# Patient Record
Sex: Female | Born: 1955 | Race: White | Hispanic: No | Smoking: Never smoker
Health system: Southern US, Community
[De-identification: ages and names within clinical notes are randomized; demographics above are authoritative.]

## PROBLEM LIST (undated history)

## (undated) DIAGNOSIS — F32A Depression, unspecified: Secondary | ICD-10-CM

## (undated) DIAGNOSIS — Z87442 Personal history of urinary calculi: Secondary | ICD-10-CM

## (undated) DIAGNOSIS — F419 Anxiety disorder, unspecified: Secondary | ICD-10-CM

## (undated) DIAGNOSIS — K219 Gastro-esophageal reflux disease without esophagitis: Secondary | ICD-10-CM

## (undated) DIAGNOSIS — F329 Major depressive disorder, single episode, unspecified: Secondary | ICD-10-CM

## (undated) HISTORY — PX: HAND SURGERY: SHX662

## (undated) HISTORY — PX: REDUCTION MAMMAPLASTY: SUR839

## (undated) HISTORY — PX: ABDOMINAL HYSTERECTOMY: SHX81

---

## 1998-08-30 ENCOUNTER — Encounter: Payer: Self-pay | Admitting: *Deleted

## 1998-08-30 ENCOUNTER — Ambulatory Visit (HOSPITAL_COMMUNITY): Admission: RE | Admit: 1998-08-30 | Discharge: 1998-08-30 | Payer: Self-pay | Admitting: *Deleted

## 1999-02-14 ENCOUNTER — Other Ambulatory Visit: Admission: RE | Admit: 1999-02-14 | Discharge: 1999-03-12 | Payer: Self-pay | Admitting: *Deleted

## 1999-12-27 ENCOUNTER — Encounter: Payer: Self-pay | Admitting: *Deleted

## 1999-12-27 ENCOUNTER — Ambulatory Visit (HOSPITAL_COMMUNITY): Admission: RE | Admit: 1999-12-27 | Discharge: 1999-12-27 | Payer: Self-pay | Admitting: *Deleted

## 2000-03-02 ENCOUNTER — Other Ambulatory Visit: Admission: RE | Admit: 2000-03-02 | Discharge: 2000-03-02 | Payer: Self-pay | Admitting: *Deleted

## 2001-01-07 ENCOUNTER — Encounter: Payer: Self-pay | Admitting: *Deleted

## 2001-01-07 ENCOUNTER — Ambulatory Visit (HOSPITAL_COMMUNITY): Admission: RE | Admit: 2001-01-07 | Discharge: 2001-01-07 | Payer: Self-pay | Admitting: *Deleted

## 2001-03-30 ENCOUNTER — Other Ambulatory Visit: Admission: RE | Admit: 2001-03-30 | Discharge: 2001-03-30 | Payer: Self-pay | Admitting: *Deleted

## 2002-04-11 ENCOUNTER — Other Ambulatory Visit: Admission: RE | Admit: 2002-04-11 | Discharge: 2002-04-11 | Payer: Self-pay | Admitting: *Deleted

## 2002-07-06 ENCOUNTER — Ambulatory Visit (HOSPITAL_COMMUNITY): Admission: RE | Admit: 2002-07-06 | Discharge: 2002-07-06 | Payer: Self-pay | Admitting: *Deleted

## 2002-07-06 ENCOUNTER — Encounter: Payer: Self-pay | Admitting: *Deleted

## 2003-04-11 ENCOUNTER — Other Ambulatory Visit: Admission: RE | Admit: 2003-04-11 | Discharge: 2003-04-11 | Payer: Self-pay | Admitting: *Deleted

## 2004-01-02 ENCOUNTER — Ambulatory Visit (HOSPITAL_COMMUNITY): Admission: RE | Admit: 2004-01-02 | Discharge: 2004-01-02 | Payer: Self-pay | Admitting: *Deleted

## 2004-03-25 ENCOUNTER — Other Ambulatory Visit: Admission: RE | Admit: 2004-03-25 | Discharge: 2004-03-25 | Payer: Self-pay | Admitting: *Deleted

## 2004-04-10 ENCOUNTER — Ambulatory Visit (HOSPITAL_BASED_OUTPATIENT_CLINIC_OR_DEPARTMENT_OTHER): Admission: RE | Admit: 2004-04-10 | Discharge: 2004-04-10 | Payer: Self-pay | Admitting: Orthopedic Surgery

## 2005-01-15 ENCOUNTER — Other Ambulatory Visit: Admission: RE | Admit: 2005-01-15 | Discharge: 2005-01-15 | Payer: Self-pay | Admitting: *Deleted

## 2005-04-14 HISTORY — PX: LITHOTRIPSY: SUR834

## 2005-08-14 ENCOUNTER — Ambulatory Visit (HOSPITAL_COMMUNITY): Admission: RE | Admit: 2005-08-14 | Discharge: 2005-08-14 | Payer: Self-pay | Admitting: *Deleted

## 2006-02-02 ENCOUNTER — Emergency Department (HOSPITAL_COMMUNITY): Admission: EM | Admit: 2006-02-02 | Discharge: 2006-02-02 | Payer: Self-pay | Admitting: Emergency Medicine

## 2006-02-05 ENCOUNTER — Ambulatory Visit (HOSPITAL_COMMUNITY): Admission: RE | Admit: 2006-02-05 | Discharge: 2006-02-05 | Payer: Self-pay | Admitting: Urology

## 2006-02-16 ENCOUNTER — Other Ambulatory Visit: Admission: RE | Admit: 2006-02-16 | Discharge: 2006-02-16 | Payer: Self-pay | Admitting: *Deleted

## 2007-01-21 ENCOUNTER — Ambulatory Visit (HOSPITAL_COMMUNITY): Admission: RE | Admit: 2007-01-21 | Discharge: 2007-01-21 | Payer: Self-pay | Admitting: *Deleted

## 2007-09-13 ENCOUNTER — Other Ambulatory Visit: Admission: RE | Admit: 2007-09-13 | Discharge: 2007-09-13 | Payer: Self-pay | Admitting: Gynecology

## 2007-09-21 ENCOUNTER — Ambulatory Visit (HOSPITAL_COMMUNITY): Admission: RE | Admit: 2007-09-21 | Discharge: 2007-09-21 | Payer: Self-pay | Admitting: Family Medicine

## 2008-03-20 ENCOUNTER — Ambulatory Visit (HOSPITAL_COMMUNITY): Admission: RE | Admit: 2008-03-20 | Discharge: 2008-03-20 | Payer: Self-pay | Admitting: Gynecology

## 2009-03-22 ENCOUNTER — Ambulatory Visit (HOSPITAL_COMMUNITY): Admission: RE | Admit: 2009-03-22 | Discharge: 2009-03-22 | Payer: Self-pay | Admitting: Gynecology

## 2010-03-25 ENCOUNTER — Ambulatory Visit (HOSPITAL_COMMUNITY)
Admission: RE | Admit: 2010-03-25 | Discharge: 2010-03-25 | Payer: Self-pay | Source: Home / Self Care | Attending: Obstetrics and Gynecology | Admitting: Obstetrics and Gynecology

## 2010-08-30 NOTE — Op Note (Signed)
NAMEANTHA, Lauren Peters                   ACCOUNT NO.:  [id number]   MEDICAL RECORD NO.:  [id number]          PATIENT TYPE:  AMB   LOCATION:  DSC                          FACILITY:  [facility]   PHYSICIAN:  Cindee Salt, M.D.       DATE OF BIRTH:  1955-09-06   DATE OF PROCEDURE:  04/10/2004  DATE OF DISCHARGE:                                 OPERATIVE REPORT   PREOPERATIVE DIAGNOSIS:  Carpal tunnel syndrome, right hand.   POSTOPERATIVE DIAGNOSIS:  Carpal tunnel syndrome, right hand.   OPERATION:  Decompression of the right median nerve.   SURGEON:  Cindee Salt, M.D.   ASSISTANTCarolyne Fiscal.   ANESTHESIA:  Forearm-based IV regional.   HISTORY:  The patient is a 55 year old female with a history of carpal  tunnel syndrome, EMG nerve conduction positive on the right hand.  She is  brought for surgical decompression.  This has not been responsive to  conservative treatment.   PROCEDURE:  The patient was brought to the operating room, where a forearm-  based IV regional anesthetic was carried out without difficulty.  She was  prepped using DuraPrep in supine position, the right arm free.  A  longitudinal incision was made in the palm and carried down through  subcutaneous tissue.  Bleeders were electrocauterized.  The palmar fascia  was split, the superficial palmar arch identified, the flexor tendon to the  ring and little finger identified to the ulnar side of the median nerve.  The carpal retinaculum was incised with sharp dissection and a right angle  and Sewell retractor were placed between skin and forearm fascia.  The  fascia was released for approximately 1.5 cm proximal to the wrist crease  under direct vision.  The canal was explored.  Tenosynovial tissue was  moderately thickened, an area of compression to the nerve apparent.  The  wound was irrigated.  The skin was closed with interrupted 5-0 nylon suture.  A sterile compressive dressing and splint were applied.  The patient  tolerated  the procedure well and was taken to the recovery room for  observation in satisfactory condition.  She is discharged home to return to  the [facility] in one week on Vicodin.       GK/MEDQ  D:  04/10/2004  T:  04/10/2004  Job:  [id number]

## 2011-02-10 ENCOUNTER — Encounter (HOSPITAL_BASED_OUTPATIENT_CLINIC_OR_DEPARTMENT_OTHER)
Admission: RE | Admit: 2011-02-10 | Discharge: 2011-02-10 | Disposition: A | Discharge status: Home / Self Care | Payer: BC Managed Care – PPO | Source: Ambulatory Visit | Attending: Orthopedic Surgery | Admitting: Orthopedic Surgery

## 2011-02-11 ENCOUNTER — Other Ambulatory Visit: Payer: Self-pay | Admitting: Orthopedic Surgery

## 2011-02-11 ENCOUNTER — Ambulatory Visit (HOSPITAL_BASED_OUTPATIENT_CLINIC_OR_DEPARTMENT_OTHER)
Admission: RE | Admit: 2011-02-11 | Discharge: 2011-02-11 | Disposition: A | Discharge status: Home / Self Care | Payer: BC Managed Care – PPO | Source: Ambulatory Visit | Attending: Orthopedic Surgery | Admitting: Orthopedic Surgery

## 2011-02-11 DIAGNOSIS — Z01810 Encounter for preprocedural cardiovascular examination: Secondary | ICD-10-CM | POA: Insufficient documentation

## 2011-02-11 DIAGNOSIS — M9380 Other specified osteochondropathies of unspecified site: Secondary | ICD-10-CM | POA: Insufficient documentation

## 2011-02-14 ENCOUNTER — Encounter: Payer: Self-pay | Admitting: Internal Medicine

## 2011-02-14 NOTE — Op Note (Signed)
Lauren Peters, Lauren Peters NO.:  [id number]  MEDICAL RECORD NO.:  [id number]  LOCATION:                                 FACILITY:  PHYSICIAN:  Betha Loa, MD             DATE OF BIRTH:  DATE OF PROCEDURE:  02/11/2011 DATE OF DISCHARGE:                              OPERATIVE REPORT   PREOPERATIVE DIAGNOSIS:  Right wrist Kienbock disease.  POSTOPERATIVE DIAGNOSIS:  Right wrist Kienbock disease.  PROCEDURE:  Right proximal row carpectomy.  SURGEON:  Betha Loa, MD  ASSISTANT:  Cindee Salt, MD  ANESTHESIA:  General with regional.  IV FLUIDS:  Per anesthesia flow sheet.  ESTIMATED BLOOD LOSS:  Minimal.  COMPLICATIONS:  None.  FINDINGS:  Collapsed lunate.  SPECIMENS:  Lunate to pathology.  TOURNIQUET TIME:  57 minutes.  DISPOSITION:  Stable to PACU.  INDICATIONS:  Lauren Peters is a 55 year old female who approximately 6 months ago began to have pain in the right wrist.  She followed up with me in the office.  She had undergone physical therapy without relief of her pain.  Radiographs followed by CT and MRI revealed a right wrist Kienbock disease.  This was stage IIIB with collapse of the lunate.  I discussed with Lauren Peters the nature of the condition.  We discussed various treatment options and settled on proximal row carpectomy as a method of treatment for her.  Risks, benefits and alternatives of surgery were discussed including the risk of blood loss, infection, damage to nerves, vessels, tendons, ligaments, bone, failure to surgery, need for additional surgery, complications with wound healing, continued pain, and stiffness.  She voiced understanding of these risks and elected to proceed.  OPERATIVE COURSE:  After being identified preoperatively by myself, the patient and I agreed upon procedure and site of procedure.  The surgical site was marked.  The risks, benefits, and alternatives of surgery were reviewed and she wished to proceed.  Surgical  consent had been signed. She was given 1 g of IV Ancef as preoperative antibiotic prophylaxis. She was transported to the operating room and placed on the operating table in supine position with the right upper extremity on an armboard. General anesthesia was induced by the anesthesiologist.  Regional block had been performed by Anesthesia in preoperative holding.  The right upper extremity was prepped and draped in normal sterile orthopedic fashion.  Surgical pause was performed between surgeons, anesthesia, and operating staff and all were in agreement as to the patient, procedure, and site of procedure.  Tourniquet at the proximal aspect of the extremities inflated to 250 mmHg after exsanguination of the limb with an Esmarch bandage.  A longitudinal incision was made, centered over the lunate fossa.  This carried into subcutaneous tissues.  Bipolar electrocautery was used for hemostasis throughout the case.  Care was taken to protect all neurovascular structures.  The third dorsal compartment was released and the EPL taken radially.  The fourth dorsal compartment was opened.  The interval between the third and fourth dorsal compartment was used.  The capsule was incised longitudinally. The synovium was very thickened.  This was sharply removed.  The lunate was visualized and had collapsed.  The proximal pole of the capitate was identified.  There was small amount of chondral damage at the radial side.  This would be nonarticular.  It was approximately 1 x 3 mm in length.  The scaphoid was then removed in a piecemeal fashion.  Care was taken to protect all volar capsule and ligaments.  Attention was then turned to the triquetrum.  It was felt that a capsular interposition was not needed.  The capsule was teed over at the proximal aspect.  The cup was left to repair afterward.  The triquetrum was then removed in its entirety.  The lunate was then removed again by sharply releasing  all attachments.  Care was taken throughout the case to protect all volar ligaments and the capsule.  The lunate was sent to pathology for examination.  The capitate dropped into the lunate fossa nicely.  The C- arm was used in AP and lateral projections to ensure appropriate position, which was the case.  The wound was then copiously irrigated with sterile saline.  It was felt that there was no impingement of the radial styloid.  The capsule was repaired using 4-0 Vicryl suture in a figure-of-eight fashion.  The fourth dorsal compartment was repaired again using 4-0 Vicryl suture.  A posterior interosseous nerve neurectomy had been performed.  Two inverted interrupted Vicryl sutures were placed in the skin and the skin was then closed with 4-0 nylon in a horizontal mattress fashion.  The wound was then dressed with sterile Xeroform, 4x4s, and wrapped with Kerlix.  A volar and dorsal slab splint was placed and wrapped with Kerlix and Ace bandage.  The tourniquet was deflated at 57 minutes.  The fingertips were pink with brisk capillary refill after deflation of the tourniquet.  A C-arm was used again in AP and lateral projections after placement of the splint to ensure appropriate position of the carpus, which was the case.  The patient was awoken from anesthesia safely.  The operative drapes had been broken down.  She was transferred back to stretcher and taken to PACU in stable condition.  I will give her Percocet 5/325 1-2 p.o. q.6 hours p.r.n. pain, dispensed #50.  We will see her back in the office in 1 week for postoperative followup.     Betha Loa, MD     KK/MEDQ  D:  02/11/2011  T:  02/11/2011  Job:  [id number]  Electronically Signed by Betha Loa  on 02/14/2011 09:10:50 AM

## 2011-03-28 ENCOUNTER — Other Ambulatory Visit: Payer: BC Managed Care – PPO | Admitting: Internal Medicine

## 2013-11-16 ENCOUNTER — Other Ambulatory Visit: Payer: Self-pay | Admitting: Sports Medicine

## 2013-11-16 DIAGNOSIS — M5442 Lumbago with sciatica, left side: Secondary | ICD-10-CM

## 2013-11-23 ENCOUNTER — Other Ambulatory Visit: Payer: BC Managed Care – PPO

## 2013-11-25 ENCOUNTER — Other Ambulatory Visit: Payer: BC Managed Care – PPO

## 2013-12-09 ENCOUNTER — Other Ambulatory Visit: Payer: BC Managed Care – PPO

## 2014-10-03 ENCOUNTER — Other Ambulatory Visit: Payer: Self-pay | Admitting: Orthopedic Surgery

## 2014-10-03 DIAGNOSIS — S46001A Unspecified injury of muscle(s) and tendon(s) of the rotator cuff of right shoulder, initial encounter: Secondary | ICD-10-CM

## 2014-10-19 ENCOUNTER — Other Ambulatory Visit: Payer: Self-pay

## 2014-10-23 ENCOUNTER — Ambulatory Visit
Admission: RE | Admit: 2014-10-23 | Discharge: 2014-10-23 | Disposition: A | Discharge status: Home / Self Care | Payer: BLUE CROSS/BLUE SHIELD | Source: Ambulatory Visit | Attending: Orthopedic Surgery | Admitting: Orthopedic Surgery

## 2014-10-23 DIAGNOSIS — S46001A Unspecified injury of muscle(s) and tendon(s) of the rotator cuff of right shoulder, initial encounter: Secondary | ICD-10-CM

## 2014-11-20 ENCOUNTER — Other Ambulatory Visit: Payer: Self-pay | Admitting: Obstetrics and Gynecology

## 2014-11-23 LAB — CYTOLOGY - PAP

## 2016-07-16 ENCOUNTER — Encounter (HOSPITAL_COMMUNITY): Payer: Self-pay

## 2016-07-16 ENCOUNTER — Emergency Department (HOSPITAL_COMMUNITY)
Admission: EM | Admit: 2016-07-16 | Discharge: 2016-07-17 | Disposition: A | Discharge status: Home / Self Care | Payer: BLUE CROSS/BLUE SHIELD | Attending: Emergency Medicine | Admitting: Emergency Medicine

## 2016-07-16 DIAGNOSIS — M7989 Other specified soft tissue disorders: Secondary | ICD-10-CM | POA: Diagnosis not present

## 2016-07-16 NOTE — ED Triage Notes (Signed)
Pt says that she has been sitting at a desk for a year which is very different than what she was doing last year

## 2016-07-16 NOTE — ED Triage Notes (Signed)
Pt complains of right leg swelling since Monday and today the right foot is aldo swollen Pt also states that it's tight and sore

## 2016-07-17 ENCOUNTER — Ambulatory Visit (HOSPITAL_BASED_OUTPATIENT_CLINIC_OR_DEPARTMENT_OTHER)
Admission: RE | Admit: 2016-07-17 | Discharge: 2016-07-17 | Disposition: A | Discharge status: Home / Self Care | Payer: BLUE CROSS/BLUE SHIELD | Source: Ambulatory Visit | Attending: Emergency Medicine | Admitting: Emergency Medicine

## 2016-07-17 DIAGNOSIS — M79661 Pain in right lower leg: Secondary | ICD-10-CM | POA: Insufficient documentation

## 2016-07-17 DIAGNOSIS — M7989 Other specified soft tissue disorders: Secondary | ICD-10-CM | POA: Diagnosis not present

## 2016-07-17 LAB — BASIC METABOLIC PANEL
Anion gap: 10 (ref 5–15)
BUN: 15 mg/dL (ref 6–20)
CALCIUM: 9.7 mg/dL (ref 8.9–10.3)
CO2: 25 mmol/L (ref 22–32)
CREATININE: 0.67 mg/dL (ref 0.44–1.00)
Chloride: 105 mmol/L (ref 101–111)
Glucose, Bld: 116 mg/dL — ABNORMAL HIGH (ref 65–99)
Potassium: 3.8 mmol/L (ref 3.5–5.1)
SODIUM: 140 mmol/L (ref 135–145)

## 2016-07-17 LAB — CBC
HCT: 40.8 % (ref 36.0–46.0)
Hemoglobin: 14.1 g/dL (ref 12.0–15.0)
MCH: 29.1 pg (ref 26.0–34.0)
MCHC: 34.6 g/dL (ref 30.0–36.0)
MCV: 84.3 fL (ref 78.0–100.0)
PLATELETS: 254 10*3/uL (ref 150–400)
RBC: 4.84 MIL/uL (ref 3.87–5.11)
RDW: 13.2 % (ref 11.5–15.5)
WBC: 7.7 10*3/uL (ref 4.0–10.5)

## 2016-07-17 LAB — D-DIMER, QUANTITATIVE (NOT AT ARMC): D DIMER QUANT: 0.34 ug{FEU}/mL (ref 0.00–0.50)

## 2016-07-17 MED ORDER — RIVAROXABAN 15 MG PO TABS
15.0000 mg | ORAL_TABLET | Freq: Once | ORAL | Status: AC
  Administered 2016-07-17: 15 mg via ORAL
  Filled 2016-07-17: qty 1

## 2016-07-17 NOTE — Progress Notes (Signed)
*  PRELIMINARY RESULTS* Vascular Ultrasound Right lower extremity venous duplex has been completed.  Preliminary findings: No evidence of deep vein thrombosis in the right lower extremity.  No evidence of Baker's cyst in the popliteal fossa although there does appear to be a small pocket of fluid near the gastronemius muscle in the proximal calf, possible ruptured cyst, 4.4 x 3.9cm?   Everrett Coombe 07/17/2016, 10:32 AM

## 2016-07-17 NOTE — ED Provider Notes (Signed)
[facility] DEPT Provider Note: Georgena Spurling, MD, FACEP  CSN: [id number] MRN: [id number] ARRIVAL: 07/16/16 at 2138 ROOM: WA21/WA21 By signing my name below, I, Dyke Brackett, attest that this documentation has been prepared under the direction and in the presence of Shanon Rosser, MD . Electronically Signed: Dyke Brackett, Scribe. 07/17/2016. 12:54 AM.   CHIEF COMPLAINT  Leg Swelling  HISTORY OF PRESENT ILLNESS  Lauren Peters is a 61 y.o. female who presents to the Emergency Department complaining of persistent, gradually worsening right leg swelling onset three days ago. She noticed her right ankle was swollen this morning. She reports associated moderate pain to her right calf, worse with ambulation or palpation, and paresthesias of her right lateral thigh. Pt states her right calf feels "tight". Pt reports that she has been sitting a lot at work lately but denies any recent injury. Pt denies chest pain or SOB.  History reviewed. No pertinent past medical history.  History reviewed. No pertinent surgical history.  History reviewed. No pertinent family history.  Social History  Substance Use Topics  . Smoking status: Never Smoker  . Smokeless tobacco: Never Used  . Alcohol use Yes    Prior to Admission medications   Medication Sig Start Date End Date Taking? Authorizing Provider  buPROPion (WELLBUTRIN) 75 MG tablet Take 75 mg by mouth daily.   Yes Historical Provider, MD  naproxen sodium (ANAPROX) 220 MG tablet Take 220 mg by mouth daily as needed (pain).   Yes Historical Provider, MD  omeprazole (PRILOSEC) 40 MG capsule Take 1 capsule by mouth daily. 06/24/16  Yes Historical Provider, MD    Allergies Albuterol and Sulfamethoxazole   REVIEW OF SYSTEMS  Negative except as noted here or in the History of Present Illness.  PHYSICAL EXAMINATION  Initial Vital Signs Blood pressure (!) 148/89, pulse 98, temperature 98.1 F (36.7 C), temperature source Oral, resp. rate 18,  height 5' (1.524 m), weight 156 lb (70.8 kg), SpO2 96 %.  Examination General: Well-developed, well-nourished female in no acute distress; appearance consistent with age of record HENT: normocephalic; atraumatic Eyes: pupils equal, round and reactive to light; extraocular muscles intact Neck: supple Heart: regular rate and rhythm Lungs: clear to auscultation bilaterally Abdomen: soft; nondistended; nontender; bowel sounds present Extremities: Full range of motion; pulses normal; tenderness and +1 edema of right calf and ankle.  Neurologic: Awake, alert and oriented; motor function intact in all extremities and symmetric; no facial droop Skin: Warm and dry Psychiatric: Normal mood and affect  RESULTS  Summary of this visit's results, reviewed by myself:   EKG Interpretation  Date/Time:    Ventricular Rate:    PR Interval:    QRS Duration:   QT Interval:    QTC Calculation:   R Axis:     Text Interpretation:        Laboratory Studies: Results for orders placed or performed during the hospital encounter of 07/16/16 (from the past 24 hour(s))  Basic metabolic panel     Status: Abnormal   Collection Time: 07/16/16 11:45 PM  Result Value Ref Range   Sodium 140 135 - 145 mmol/L   Potassium 3.8 3.5 - 5.1 mmol/L   Chloride 105 101 - 111 mmol/L   CO2 25 22 - 32 mmol/L   Glucose, Bld 116 (H) 65 - 99 mg/dL   BUN 15 6 - 20 mg/dL   Creatinine, Ser 0.67 0.44 - 1.00 mg/dL   Calcium 9.7 8.9 - 10.3 mg/dL   GFR calc  non Af Amer >60 >60 mL/min   GFR calc Af Amer >60 >60 mL/min   Anion gap 10 5 - 15  CBC     Status: None   Collection Time: 07/16/16 11:45 PM  Result Value Ref Range   WBC 7.7 4.0 - 10.5 K/uL   RBC 4.84 3.87 - 5.11 MIL/uL   Hemoglobin 14.1 12.0 - 15.0 g/dL   HCT 40.8 36.0 - 46.0 %   MCV 84.3 78.0 - 100.0 fL   MCH 29.1 26.0 - 34.0 pg   MCHC 34.6 30.0 - 36.0 g/dL   RDW 13.2 11.5 - 15.5 %   Platelets 254 150 - 400 K/uL  D-dimer, quantitative (not at [facility])     Status:  None   Collection Time: 07/16/16 11:45 PM  Result Value Ref Range   D-Dimer, Quant 0.34 0.00 - 0.50 ug/mL-FEU   Imaging Studies: No results found.  ED COURSE  Nursing notes and initial vitals signs, including pulse oximetry, reviewed.  Vitals:   07/16/16 2143 07/16/16 2351  BP: (!) 168/102 (!) 148/89  Pulse: (!) 116 98  Resp: 16 18  Temp: 98.3 F (36.8 C) 98.1 F (36.7 C)  TempSrc: Oral Oral  SpO2: 96% 96%  Weight: 174 lb (78.9 kg) 156 lb (70.8 kg)  Height:  5' (1.524 m)   1:03 AM We'll treat with Xarelto (patient refuses Lovenox injection) and have her follow-up with the vascular lab at [facility] later this morning for Doppler ultrasound.  PROCEDURES    ED DIAGNOSES     ICD-9-CM ICD-10-CM   1. Right leg swelling 729.81 M79.89      I personally performed the services described in this documentation, which was scribed in my presence. The recorded information has been reviewed and is accurate.    Shanon Rosser, MD 07/17/16 [phone]

## 2017-05-18 ENCOUNTER — Other Ambulatory Visit: Payer: Self-pay | Admitting: Obstetrics and Gynecology

## 2017-05-18 DIAGNOSIS — R928 Other abnormal and inconclusive findings on diagnostic imaging of breast: Secondary | ICD-10-CM

## 2017-05-20 ENCOUNTER — Ambulatory Visit
Admission: RE | Admit: 2017-05-20 | Discharge: 2017-05-20 | Disposition: A | Discharge status: Home / Self Care | Payer: BLUE CROSS/BLUE SHIELD | Source: Ambulatory Visit | Attending: Obstetrics and Gynecology | Admitting: Obstetrics and Gynecology

## 2017-05-20 DIAGNOSIS — R928 Other abnormal and inconclusive findings on diagnostic imaging of breast: Secondary | ICD-10-CM

## 2017-10-21 DIAGNOSIS — R0781 Pleurodynia: Secondary | ICD-10-CM | POA: Diagnosis not present

## 2017-10-21 DIAGNOSIS — J069 Acute upper respiratory infection, unspecified: Secondary | ICD-10-CM | POA: Diagnosis not present

## 2017-10-21 DIAGNOSIS — N390 Urinary tract infection, site not specified: Secondary | ICD-10-CM | POA: Diagnosis not present

## 2017-12-01 DIAGNOSIS — R635 Abnormal weight gain: Secondary | ICD-10-CM | POA: Diagnosis not present

## 2017-12-30 DIAGNOSIS — L292 Pruritus vulvae: Secondary | ICD-10-CM | POA: Diagnosis not present

## 2017-12-30 DIAGNOSIS — R300 Dysuria: Secondary | ICD-10-CM | POA: Diagnosis not present

## 2017-12-30 DIAGNOSIS — N393 Stress incontinence (female) (male): Secondary | ICD-10-CM | POA: Diagnosis not present

## 2018-01-19 DIAGNOSIS — R635 Abnormal weight gain: Secondary | ICD-10-CM | POA: Diagnosis not present

## 2018-01-19 DIAGNOSIS — E559 Vitamin D deficiency, unspecified: Secondary | ICD-10-CM | POA: Diagnosis not present

## 2018-01-19 DIAGNOSIS — Z6831 Body mass index (BMI) 31.0-31.9, adult: Secondary | ICD-10-CM | POA: Diagnosis not present

## 2018-02-08 DIAGNOSIS — N9089 Other specified noninflammatory disorders of vulva and perineum: Secondary | ICD-10-CM | POA: Diagnosis not present

## 2018-03-02 DIAGNOSIS — R635 Abnormal weight gain: Secondary | ICD-10-CM | POA: Diagnosis not present

## 2018-03-02 DIAGNOSIS — E559 Vitamin D deficiency, unspecified: Secondary | ICD-10-CM | POA: Diagnosis not present

## 2018-03-05 DIAGNOSIS — J0100 Acute maxillary sinusitis, unspecified: Secondary | ICD-10-CM | POA: Diagnosis not present

## 2018-04-13 DIAGNOSIS — R635 Abnormal weight gain: Secondary | ICD-10-CM | POA: Diagnosis not present

## 2018-04-13 DIAGNOSIS — R0602 Shortness of breath: Secondary | ICD-10-CM | POA: Diagnosis not present

## 2018-06-08 DIAGNOSIS — Z6831 Body mass index (BMI) 31.0-31.9, adult: Secondary | ICD-10-CM | POA: Diagnosis not present

## 2018-06-08 DIAGNOSIS — Z01419 Encounter for gynecological examination (general) (routine) without abnormal findings: Secondary | ICD-10-CM | POA: Diagnosis not present

## 2018-06-08 DIAGNOSIS — Z1231 Encounter for screening mammogram for malignant neoplasm of breast: Secondary | ICD-10-CM | POA: Diagnosis not present

## 2018-06-25 DIAGNOSIS — R635 Abnormal weight gain: Secondary | ICD-10-CM | POA: Diagnosis not present

## 2018-08-03 DIAGNOSIS — R635 Abnormal weight gain: Secondary | ICD-10-CM | POA: Diagnosis not present

## 2018-08-16 DIAGNOSIS — F325 Major depressive disorder, single episode, in full remission: Secondary | ICD-10-CM | POA: Diagnosis not present

## 2018-08-16 DIAGNOSIS — K219 Gastro-esophageal reflux disease without esophagitis: Secondary | ICD-10-CM | POA: Diagnosis not present

## 2018-08-17 DIAGNOSIS — N23 Unspecified renal colic: Secondary | ICD-10-CM | POA: Diagnosis not present

## 2018-08-17 DIAGNOSIS — R109 Unspecified abdominal pain: Secondary | ICD-10-CM | POA: Diagnosis not present

## 2018-08-17 DIAGNOSIS — R319 Hematuria, unspecified: Secondary | ICD-10-CM | POA: Diagnosis not present

## 2018-08-19 ENCOUNTER — Emergency Department (HOSPITAL_COMMUNITY)
Admission: EM | Admit: 2018-08-19 | Discharge: 2018-08-19 | Disposition: A | Discharge status: Home / Self Care | Payer: BLUE CROSS/BLUE SHIELD | Attending: Emergency Medicine | Admitting: Emergency Medicine

## 2018-08-19 ENCOUNTER — Emergency Department (HOSPITAL_COMMUNITY): Payer: BLUE CROSS/BLUE SHIELD

## 2018-08-19 ENCOUNTER — Other Ambulatory Visit: Payer: Self-pay

## 2018-08-19 DIAGNOSIS — Z87442 Personal history of urinary calculi: Secondary | ICD-10-CM | POA: Insufficient documentation

## 2018-08-19 DIAGNOSIS — N201 Calculus of ureter: Secondary | ICD-10-CM | POA: Insufficient documentation

## 2018-08-19 DIAGNOSIS — Z79899 Other long term (current) drug therapy: Secondary | ICD-10-CM | POA: Insufficient documentation

## 2018-08-19 DIAGNOSIS — R110 Nausea: Secondary | ICD-10-CM | POA: Insufficient documentation

## 2018-08-19 DIAGNOSIS — R1032 Left lower quadrant pain: Secondary | ICD-10-CM | POA: Diagnosis present

## 2018-08-19 DIAGNOSIS — N130 Hydronephrosis with ureteropelvic junction obstruction: Secondary | ICD-10-CM | POA: Insufficient documentation

## 2018-08-19 DIAGNOSIS — N200 Calculus of kidney: Secondary | ICD-10-CM | POA: Diagnosis not present

## 2018-08-19 DIAGNOSIS — N132 Hydronephrosis with renal and ureteral calculous obstruction: Secondary | ICD-10-CM | POA: Diagnosis not present

## 2018-08-19 LAB — COMPREHENSIVE METABOLIC PANEL
ALT: 19 U/L (ref 0–44)
AST: 16 U/L (ref 15–41)
Albumin: 3.9 g/dL (ref 3.5–5.0)
Alkaline Phosphatase: 77 U/L (ref 38–126)
Anion gap: 9 (ref 5–15)
BUN: 9 mg/dL (ref 8–23)
CO2: 24 mmol/L (ref 22–32)
Calcium: 9 mg/dL (ref 8.9–10.3)
Chloride: 108 mmol/L (ref 98–111)
Creatinine, Ser: 0.93 mg/dL (ref 0.44–1.00)
GFR calc Af Amer: >60 mL/min (ref >60)
GFR calc non Af Amer: >60 mL/min (ref >60)
Glucose, Bld: 154 mg/dL — ABNORMAL HIGH (ref 70–99)
Potassium: 3.7 mmol/L (ref 3.5–5.1)
Sodium: 141 mmol/L (ref 135–145)
Total Bilirubin: 0.8 mg/dL (ref 0.3–1.2)
Total Protein: 6.6 g/dL (ref 6.5–8.1)

## 2018-08-19 LAB — CBC WITH DIFFERENTIAL/PLATELET
Abs Immature Granulocytes: 0.04 10*3/uL (ref 0.00–0.07)
Basophils Absolute: 0 10*3/uL (ref 0.0–0.1)
Basophils Relative: 1 %
Eosinophils Absolute: 0.2 10*3/uL (ref 0.0–0.5)
Eosinophils Relative: 4 %
HCT: 43.8 % (ref 36.0–46.0)
Hemoglobin: 14.8 g/dL (ref 12.0–15.0)
Immature Granulocytes: 1 %
Lymphocytes Relative: 26 %
Lymphs Abs: 1.5 10*3/uL (ref 0.7–4.0)
MCH: 29.6 pg (ref 26.0–34.0)
MCHC: 33.8 g/dL (ref 30.0–36.0)
MCV: 87.6 fL (ref 80.0–100.0)
Monocytes Absolute: 0.4 10*3/uL (ref 0.1–1.0)
Monocytes Relative: 7 %
Neutro Abs: 3.8 10*3/uL (ref 1.7–7.7)
Neutrophils Relative %: 61 %
Platelets: 250 10*3/uL (ref 150–400)
RBC: 5 MIL/uL (ref 3.87–5.11)
RDW: 13 % (ref 11.5–15.5)
WBC: 6 10*3/uL (ref 4.0–10.5)
nRBC: 0 % (ref 0.0–0.2)

## 2018-08-19 LAB — URINALYSIS, ROUTINE W REFLEX MICROSCOPIC
Bilirubin Urine: NEGATIVE
Glucose, UA: NEGATIVE mg/dL
Ketones, ur: NEGATIVE mg/dL
Nitrite: NEGATIVE
Protein, ur: NEGATIVE mg/dL
RBC / HPF: >50 RBC/hpf — ABNORMAL HIGH (ref 0–5)
Specific Gravity, Urine: 1.018 (ref 1.005–1.030)
pH: 5 (ref 5.0–8.0)

## 2018-08-19 MED ORDER — KETOROLAC TROMETHAMINE 30 MG/ML IJ SOLN
30.0000 mg | Freq: Once | INTRAMUSCULAR | Status: AC
  Administered 2018-08-19: 30 mg via INTRAVENOUS
  Filled 2018-08-19: qty 1

## 2018-08-19 MED ORDER — HYDROMORPHONE HCL 1 MG/ML IJ SOLN
1.0000 mg | Freq: Once | INTRAMUSCULAR | Status: AC
  Administered 2018-08-19: 1 mg via INTRAVENOUS
  Filled 2018-08-19: qty 1

## 2018-08-19 NOTE — ED Notes (Signed)
Got patient into a gown patient is resting with call bell in reach 

## 2018-08-19 NOTE — Discharge Instructions (Addendum)
Please follow-up at the urology office today for further management of your kidney stone. Take the pain medication that was prescribed to you. Return to the ED if you start to develop a fever, worsening pain or vomiting, chest pain or loss of consciousness.

## 2018-08-19 NOTE — ED Triage Notes (Signed)
Pt here for L flank pain x 2 days, worsening this morning. Hx kidney stones and sts it feels the same. Seen by PCP Tuesday and was given Flomax.

## 2018-08-19 NOTE — ED Notes (Signed)
Patient verbalizes understanding of discharge instructions. Opportunity for questioning and answers were provided. Armband removed by staff, pt discharged from ED via wc with family

## 2018-08-19 NOTE — ED Provider Notes (Signed)
[facility] EMERGENCY DEPARTMENT Provider Note   CSN: [id number] Arrival date & time: 08/19/18  5956    History   Chief Complaint Chief Complaint  Patient presents with   Flank Pain    HPI Lauren Peters is a 63 y.o. female who presents to ED for evaluation of 2-day history of left-sided flank pain.  Describes the pain as sharp, gradually worsening since this morning.  Pain has been intermittent.  She was seen and evaluated by PCP 2 days ago, urinalysis was done which showed hematuria and was thought to be consistent with h/o kidney stone and discharged home with Flomax and hydrocodone.  She has been taking the Flomax but has not been taking her hydrocodone because "I do not like taking pain medicine." She did not take pain medicine this morning when her pain got worse because "I figured I was going to come to the emergency room so I didn't want to take anything." She reports some nausea 2/2 pain but denies any changes to bowel movements, fever, vomiting, dysuria, injuries or falls, shortness of breath, leg pain.  States that she has had to undergo lithotripsy x1 but her other kidney stones have passed without difficulty.     HPI  No past medical history on file.  There are no active problems to display for this patient.   Past Surgical History:  Procedure Laterality Date   REDUCTION MAMMAPLASTY       OB History   No obstetric history on file.      Home Medications    Prior to Admission medications   Medication Sig Start Date End Date Taking? Authorizing Provider  buPROPion (WELLBUTRIN) 75 MG tablet Take 75 mg by mouth daily.    [provider]  naproxen sodium (ANAPROX) 220 MG tablet Take 220 mg by mouth daily as needed (pain).    [provider]  omeprazole (PRILOSEC) 40 MG capsule Take 1 capsule by mouth daily. 06/24/16   [provider]    Family History Family History  Problem Relation Age of Onset   Breast cancer Other      Social History Social History   Tobacco Use   Smoking status: Never Smoker   Smokeless tobacco: Never Used  Substance Use Topics   Alcohol use: Yes   Drug use: Not on file     Allergies   Albuterol and Sulfamethoxazole   Review of Systems Review of Systems  Constitutional: Negative for appetite change, chills and fever.  HENT: Negative for ear pain, rhinorrhea, sneezing and sore throat.   Eyes: Negative for photophobia and visual disturbance.  Respiratory: Negative for cough, chest tightness, shortness of breath and wheezing.   Cardiovascular: Negative for chest pain and palpitations.  Gastrointestinal: Positive for nausea. Negative for abdominal pain, blood in stool, constipation, diarrhea and vomiting.  Genitourinary: Positive for flank pain. Negative for dysuria, hematuria and urgency.  Musculoskeletal: Negative for myalgias.  Skin: Negative for rash.  Neurological: Negative for dizziness, weakness and light-headedness.     Physical Exam Updated Vital Signs BP 134/73    Pulse 99    Temp 98.2 F (36.8 C) (Oral)    Resp 18    Ht 5' (1.524 m)    Wt 72.6 kg    SpO2 95%    BMI 31.25 kg/m   Physical Exam Vitals signs and nursing note reviewed.  Constitutional:      General: She is not in acute distress.    Appearance: She is  well-developed.     Comments: Appears uncomfortable.  HENT:     Head: Normocephalic and atraumatic.     Nose: Nose normal.  Eyes:     General: No scleral icterus.       Right eye: No discharge.        Left eye: No discharge.     Conjunctiva/sclera: Conjunctivae normal.  Neck:     Musculoskeletal: Normal range of motion and neck supple.  Cardiovascular:     Rate and Rhythm: Normal rate and regular rhythm.     Heart sounds: Normal heart sounds. No murmur. No friction rub. No gallop.   Pulmonary:     Effort: Pulmonary effort is normal. No respiratory distress.     Breath sounds: Normal breath sounds.  Abdominal:     General: Bowel  sounds are normal. There is no distension.     Palpations: Abdomen is soft.     Tenderness: There is abdominal tenderness. There is no guarding.     Comments: L flank.  Musculoskeletal: Normal range of motion.  Skin:    General: Skin is warm and dry.     Findings: No rash.  Neurological:     Mental Status: She is alert.     Motor: No abnormal muscle tone.     Coordination: Coordination normal.      ED Treatments / Results  Labs (all labs ordered are listed, but only abnormal results are displayed) Labs Reviewed  COMPREHENSIVE METABOLIC PANEL - Abnormal; Notable for the following components:      Result Value   Glucose, Bld 154 (*)    All other components within normal limits  URINALYSIS, ROUTINE W REFLEX MICROSCOPIC - Abnormal; Notable for the following components:   APPearance HAZY (*)    Hgb urine dipstick LARGE (*)    Leukocytes,Ua MODERATE (*)    RBC / HPF >50 (*)    Bacteria, UA RARE (*)    All other components within normal limits  URINE CULTURE  CBC WITH DIFFERENTIAL/PLATELET    EKG None  Radiology Ct Renal Stone Study  Result Date: 08/19/2018 CLINICAL DATA:  Left flank pain EXAM: CT ABDOMEN AND PELVIS WITHOUT CONTRAST TECHNIQUE: Multidetector CT imaging of the abdomen and pelvis was performed following the standard protocol without IV contrast. COMPARISON:  None. FINDINGS: Lower chest: No acute abnormality. Hepatobiliary: No focal liver abnormality is seen. No gallstones, gallbladder wall thickening, or biliary dilatation. Pancreas: Unremarkable. No pancreatic ductal dilatation or surrounding inflammatory changes. Spleen: Normal in size without focal abnormality. Adrenals/Urinary Tract: Adrenal glands are unremarkable. There is an 8 mm obstructive calculus at the left ureteropelvic junction with mild left hydronephrosis. There are no additional renal or ureteral calculi. Bladder is unremarkable. Stomach/Bowel: Stomach is within normal limits. Appendix appears normal.  No evidence of bowel wall thickening, distention, or inflammatory changes. Vascular/Lymphatic: Scattered calcific atherosclerosis. No enlarged abdominal or pelvic lymph nodes. Reproductive: Status post hysterectomy. Other: No abdominal wall hernia or abnormality. No abdominopelvic ascites. Musculoskeletal: No acute or significant osseous findings. IMPRESSION: There is an 8 mm obstructive calculus at the left ureteropelvic junction with mild left hydronephrosis. There are no additional renal or ureteral calculi. Electronically Signed   By: Eddie Candle M.D.   On: 08/19/2018 09:58    Procedures Procedures (including critical care time)  Medications Ordered in ED Medications  HYDROmorphone (DILAUDID) injection 1 mg (1 mg Intravenous Given 08/19/18 0924)  ketorolac (TORADOL) 30 MG/ML injection 30 mg (30 mg Intravenous Given 08/19/18 1055)  Initial Impression / Assessment and Plan / ED Course  I have reviewed the triage vital signs and the nursing notes.  Pertinent labs & imaging results that were available during my care of the patient were reviewed by me and considered in my medical decision making (see chart for details).        63 year old female presents to ED for 2-day history of left-sided flank pain.  She reports associated nausea secondary to the pain.  She was given Flomax and hydrocodone by her PCP.  She has been taking the Flomax but does not enjoy taking pain medication so she has not taken the hydrocodone.  She states that this feels similar to her prior kidney stones which have required lithotripsy once but have otherwise passed.  She denies any fevers, dysuria or gross hematuria.  PCP diagnosed probable kidney stones based on hematuria on urinalysis 2 days ago.  On my exam patient does appear uncomfortable.  She complains of left-sided pain.  Abdomen is soft, nontender nondistended.  Vital signs are within normal limits, she denies fever any recent use of antipyretics.  Urinalysis  shows hematuria, moderate leukocytes, rare bacteria and some pyuria.  CBC, CMP unremarkable.  CT renal stone study shows 8 mm stone at the left UPJ.  Patient given Dilaudid with some improvement in her symptoms.  I will consult urology for further recommendations. Her primary urologist is Dr. Risa Grill at [facility].  10:41 AM Spoke to Dr. Junious Silk, urologist.  He recommends giving patient Toradol and following up in the office for possible shockwave lithotripsy, as long as patient appears stable, not febrile or septic.  11:26 AM Patient given Toradol with significant improvement in her symptoms.  Her daughter has called the urology office and is scheduled for an appointment at 2:30 PM today.  She was encouraged to take her home pain medication as needed and return for any worsening symptoms.  Patient is agreeable to the plan.  Patient is hemodynamically stable, in NAD, and able to ambulate in the ED. Evaluation does not show pathology that would require ongoing emergent intervention or inpatient treatment. I explained the diagnosis to the patient. Pain has been managed and has no complaints prior to discharge. Patient is comfortable with above plan and is stable for discharge at this time. All questions were answered prior to disposition. Strict return precautions for returning to the ED were discussed. Encouraged follow up with PCP.   An After Visit Summary was printed and given to the patient.   Portions of this note were generated with [employer]. Dictation errors may occur despite best attempts at proofreading.  Final Clinical Impressions(s) / ED Diagnoses   Final diagnoses:  Ureterolithiasis    ED Discharge Orders    None       Delia Heady, PA-C 08/19/18 1243    Lennice Sites, DO 08/19/18 1626

## 2018-08-19 NOTE — ED Notes (Signed)
Patient transported to CT 

## 2018-08-20 ENCOUNTER — Encounter (HOSPITAL_COMMUNITY): Payer: Self-pay | Admitting: General Practice

## 2018-08-20 ENCOUNTER — Other Ambulatory Visit (HOSPITAL_COMMUNITY)
Admission: RE | Admit: 2018-08-20 | Discharge: 2018-08-20 | Disposition: A | Discharge status: Home / Self Care | Payer: BLUE CROSS/BLUE SHIELD | Source: Ambulatory Visit | Attending: Urology | Admitting: Urology

## 2018-08-20 ENCOUNTER — Other Ambulatory Visit: Payer: Self-pay | Admitting: Urology

## 2018-08-20 DIAGNOSIS — Z1159 Encounter for screening for other viral diseases: Secondary | ICD-10-CM | POA: Insufficient documentation

## 2018-08-20 LAB — URINE CULTURE

## 2018-08-22 LAB — NOVEL CORONAVIRUS, NAA (HOSP ORDER, SEND-OUT TO REF LAB; TAT 18-24 HRS): SARS-CoV-2, NAA: NOT DETECTED

## 2018-08-22 NOTE — H&P (Signed)
H&P  Chief Complaint: Kidney stone  History of Present Illness: Lauren Peters is a 63 y.o. year old female presenting for ESL for mgmt of urolithiasis.  She underwent CT scan of the abdomen and pelvis on 08/19/2018 which revealed an 8 mm left UPJ stone (visible, 10 cm ssd, HU 1600).    Past Medical History:  Diagnosis Date  . Acid reflux   . Anxiety   . Depression   . History of kidney stones     Past Surgical History:  Procedure Laterality Date  . ABDOMINAL HYSTERECTOMY     over 30 years ago  . HAND SURGERY Right    x2 on right hand, carpal tunnel, bone removed  . LITHOTRIPSY  2007  . REDUCTION MAMMAPLASTY     20 years ago    Home Medications:  No medications prior to admission.    Allergies:  Allergies  Allergen Reactions  . Albuterol Other (See Comments)    Hot flashes/ flush/   . Sulfamethoxazole Rash    Family History  Problem Relation Age of Onset  . Breast cancer Other     Social History:  reports that she has never smoked. She has never used smokeless tobacco. She reports current alcohol use. No history on file for drug.  ROS: A complete review of systems was performed.  All systems are negative except for pertinent findings as noted.  Physical Exam:  Vital signs in last 24 hours:   General:  Alert and oriented, No acute distress HEENT: Normocephalic, atraumatic Neck: No JVD or lymphadenopathy Cardiovascular: Regular rate and rhythm Lungs: Clear bilaterally Abdomen: Soft, nontender, nondistended, no abdominal masses Back: No CVA tenderness Extremities: No edema Neurologic: Grossly intact  Laboratory Data:  No results found for this or any previous visit (from the past 24 hour(s)). Recent Results (from the past 240 hour(s))  Urine culture     Status: Abnormal   Collection Time: 08/19/18  9:17 AM  Result Value Ref Range Status   Specimen Description URINE, RANDOM  Final   Special Requests   Final    NONE Performed at [facility],  [address], [place], [place] [address]    Culture MULTIPLE SPECIES PRESENT, SUGGEST RECOLLECTION (A)  Final   Report Status 08/20/2018 FINAL  Final   Creatinine: Recent Labs    08/19/18 0917  CREATININE 0.93    Radiologic Imaging: No results found.  Impression/Assessment:  8 mm left UPJ stone  Plan:  ESL. The pt is aware that this may be part of a staged process.  Lillette Boxer Nyia Tsao 08/22/2018, 9:00 PM  Green Marquis Diles MD

## 2018-08-23 ENCOUNTER — Ambulatory Visit (HOSPITAL_COMMUNITY): Payer: BLUE CROSS/BLUE SHIELD

## 2018-08-23 ENCOUNTER — Encounter (HOSPITAL_COMMUNITY)
Admission: RE | Disposition: A | Discharge status: Home / Self Care | Payer: Self-pay | Source: Home / Self Care | Attending: Urology

## 2018-08-23 ENCOUNTER — Ambulatory Visit (HOSPITAL_COMMUNITY)
Admission: RE | Admit: 2018-08-23 | Discharge: 2018-08-23 | Disposition: A | Discharge status: Home / Self Care | Payer: BLUE CROSS/BLUE SHIELD | Attending: Urology | Admitting: Urology

## 2018-08-23 ENCOUNTER — Encounter (HOSPITAL_COMMUNITY): Payer: Self-pay | Admitting: General Practice

## 2018-08-23 DIAGNOSIS — F329 Major depressive disorder, single episode, unspecified: Secondary | ICD-10-CM | POA: Insufficient documentation

## 2018-08-23 DIAGNOSIS — N135 Crossing vessel and stricture of ureter without hydronephrosis: Secondary | ICD-10-CM

## 2018-08-23 DIAGNOSIS — F419 Anxiety disorder, unspecified: Secondary | ICD-10-CM | POA: Insufficient documentation

## 2018-08-23 DIAGNOSIS — N200 Calculus of kidney: Secondary | ICD-10-CM | POA: Insufficient documentation

## 2018-08-23 DIAGNOSIS — K219 Gastro-esophageal reflux disease without esophagitis: Secondary | ICD-10-CM | POA: Diagnosis not present

## 2018-08-23 DIAGNOSIS — Z87442 Personal history of urinary calculi: Secondary | ICD-10-CM | POA: Diagnosis not present

## 2018-08-23 DIAGNOSIS — N201 Calculus of ureter: Secondary | ICD-10-CM | POA: Diagnosis not present

## 2018-08-23 HISTORY — DX: Gastro-esophageal reflux disease without esophagitis: K21.9

## 2018-08-23 HISTORY — DX: Anxiety disorder, unspecified: F41.9

## 2018-08-23 HISTORY — DX: Depression, unspecified: F32.A

## 2018-08-23 HISTORY — PX: EXTRACORPOREAL SHOCK WAVE LITHOTRIPSY: SHX1557

## 2018-08-23 HISTORY — DX: Personal history of urinary calculi: Z87.442

## 2018-08-23 HISTORY — DX: Major depressive disorder, single episode, unspecified: F32.9

## 2018-08-23 SURGERY — LITHOTRIPSY, ESWL
Anesthesia: LOCAL | Laterality: Left

## 2018-08-23 MED ORDER — SODIUM CHLORIDE 0.9 % IV SOLN
INTRAVENOUS | Status: DC
  Administered 2018-08-23: 11:00:00 via INTRAVENOUS

## 2018-08-23 MED ORDER — HYDROCODONE-ACETAMINOPHEN 5-325 MG PO TABS
1.0000 | ORAL_TABLET | Freq: Once | ORAL | Status: AC
  Administered 2018-08-23: 1 via ORAL

## 2018-08-23 MED ORDER — DIAZEPAM 5 MG PO TABS
10.0000 mg | ORAL_TABLET | ORAL | Status: AC
  Administered 2018-08-23: 10 mg via ORAL
  Filled 2018-08-23: qty 2

## 2018-08-23 MED ORDER — DIPHENHYDRAMINE HCL 25 MG PO CAPS
25.0000 mg | ORAL_CAPSULE | ORAL | Status: AC
  Administered 2018-08-23: 25 mg via ORAL
  Filled 2018-08-23: qty 1

## 2018-08-23 MED ORDER — HYDROCODONE-ACETAMINOPHEN 5-325 MG PO TABS
ORAL_TABLET | ORAL | Status: AC
  Filled 2018-08-23: qty 1

## 2018-08-23 MED ORDER — CIPROFLOXACIN HCL 500 MG PO TABS
500.0000 mg | ORAL_TABLET | ORAL | Status: AC
  Administered 2018-08-23: 500 mg via ORAL
  Filled 2018-08-23: qty 1

## 2018-08-23 SURGICAL SUPPLY — 4 items
COVER SURGICAL LIGHT HANDLE (MISCELLANEOUS) ×2 IMPLANT
COVER WAND RF STERILE (DRAPES) IMPLANT
KIT TURNOVER KIT A (KITS) IMPLANT
TOWEL OR 17X26 10 PK STRL BLUE (TOWEL DISPOSABLE) ×2 IMPLANT

## 2018-08-23 NOTE — Interval H&P Note (Signed)
History and Physical Interval Note:  08/23/2018 1:14 PM  [place]  has presented today for surgery, with the diagnosis of LEFT URETEROPELVIC JUNCTION STONE.  The various methods of treatment have been discussed with the patient and family. After consideration of risks, benefits and other options for treatment, the patient has consented to  Procedure(s): EXTRACORPOREAL SHOCK WAVE LITHOTRIPSY (ESWL) (Left) as a surgical intervention.  The patient's history has been reviewed, patient examined, no change in status, stable for surgery.  I have reviewed the patient's chart and labs.  Questions were answered to the patient's satisfaction.     Lillette Boxer Panayiota Larkin

## 2018-08-23 NOTE — Op Note (Signed)
See [facility] OP note scanned into chart. 

## 2018-08-23 NOTE — Discharge Instructions (Signed)
See [facility] discharge instructions in chart.  

## 2018-08-24 ENCOUNTER — Encounter (HOSPITAL_COMMUNITY): Payer: Self-pay | Admitting: Urology

## 2018-09-07 DIAGNOSIS — N201 Calculus of ureter: Secondary | ICD-10-CM | POA: Diagnosis not present

## 2018-11-09 DIAGNOSIS — N201 Calculus of ureter: Secondary | ICD-10-CM | POA: Diagnosis not present

## 2018-12-07 DIAGNOSIS — N201 Calculus of ureter: Secondary | ICD-10-CM | POA: Diagnosis not present

## 2019-03-29 DIAGNOSIS — M7581 Other shoulder lesions, right shoulder: Secondary | ICD-10-CM | POA: Diagnosis not present

## 2019-04-20 ENCOUNTER — Ambulatory Visit: Payer: BC Managed Care – PPO | Attending: Internal Medicine

## 2019-04-20 DIAGNOSIS — Z20822 Contact with and (suspected) exposure to covid-19: Secondary | ICD-10-CM | POA: Diagnosis not present

## 2019-04-20 DIAGNOSIS — R52 Pain, unspecified: Secondary | ICD-10-CM | POA: Diagnosis not present

## 2019-04-20 DIAGNOSIS — R0981 Nasal congestion: Secondary | ICD-10-CM | POA: Diagnosis not present

## 2019-04-20 DIAGNOSIS — G44201 Tension-type headache, unspecified, intractable: Secondary | ICD-10-CM | POA: Diagnosis not present

## 2019-04-21 LAB — NOVEL CORONAVIRUS, NAA: SARS-CoV-2, NAA: DETECTED — AB

## 2019-04-23 ENCOUNTER — Other Ambulatory Visit: Payer: Self-pay

## 2019-04-23 ENCOUNTER — Emergency Department (HOSPITAL_BASED_OUTPATIENT_CLINIC_OR_DEPARTMENT_OTHER): Payer: BC Managed Care – PPO

## 2019-04-23 ENCOUNTER — Inpatient Hospital Stay (HOSPITAL_BASED_OUTPATIENT_CLINIC_OR_DEPARTMENT_OTHER)
Admission: EM | Admit: 2019-04-23 | Discharge: 2019-05-01 | DRG: 871 | Disposition: A | Discharge status: Home / Self Care | Payer: BC Managed Care – PPO | Attending: Internal Medicine | Admitting: Internal Medicine

## 2019-04-23 ENCOUNTER — Encounter (HOSPITAL_BASED_OUTPATIENT_CLINIC_OR_DEPARTMENT_OTHER): Payer: Self-pay | Admitting: *Deleted

## 2019-04-23 DIAGNOSIS — Z79899 Other long term (current) drug therapy: Secondary | ICD-10-CM

## 2019-04-23 DIAGNOSIS — R0902 Hypoxemia: Secondary | ICD-10-CM | POA: Diagnosis present

## 2019-04-23 DIAGNOSIS — I10 Essential (primary) hypertension: Secondary | ICD-10-CM | POA: Diagnosis present

## 2019-04-23 DIAGNOSIS — Z888 Allergy status to other drugs, medicaments and biological substances status: Secondary | ICD-10-CM

## 2019-04-23 DIAGNOSIS — D696 Thrombocytopenia, unspecified: Secondary | ICD-10-CM | POA: Diagnosis not present

## 2019-04-23 DIAGNOSIS — F419 Anxiety disorder, unspecified: Secondary | ICD-10-CM | POA: Diagnosis present

## 2019-04-23 DIAGNOSIS — J1282 Pneumonia due to coronavirus disease 2019: Secondary | ICD-10-CM | POA: Diagnosis not present

## 2019-04-23 DIAGNOSIS — A4189 Other specified sepsis: Secondary | ICD-10-CM | POA: Diagnosis not present

## 2019-04-23 DIAGNOSIS — L89321 Pressure ulcer of left buttock, stage 1: Secondary | ICD-10-CM | POA: Diagnosis present

## 2019-04-23 DIAGNOSIS — Z882 Allergy status to sulfonamides status: Secondary | ICD-10-CM | POA: Diagnosis not present

## 2019-04-23 DIAGNOSIS — J9601 Acute respiratory failure with hypoxia: Secondary | ICD-10-CM | POA: Diagnosis present

## 2019-04-23 DIAGNOSIS — R279 Unspecified lack of coordination: Secondary | ICD-10-CM | POA: Diagnosis not present

## 2019-04-23 DIAGNOSIS — L89311 Pressure ulcer of right buttock, stage 1: Secondary | ICD-10-CM | POA: Diagnosis not present

## 2019-04-23 DIAGNOSIS — U071 COVID-19: Secondary | ICD-10-CM | POA: Diagnosis not present

## 2019-04-23 DIAGNOSIS — F329 Major depressive disorder, single episode, unspecified: Secondary | ICD-10-CM | POA: Diagnosis present

## 2019-04-23 DIAGNOSIS — Z743 Need for continuous supervision: Secondary | ICD-10-CM | POA: Diagnosis not present

## 2019-04-23 DIAGNOSIS — R531 Weakness: Secondary | ICD-10-CM | POA: Diagnosis not present

## 2019-04-23 DIAGNOSIS — R0602 Shortness of breath: Secondary | ICD-10-CM | POA: Diagnosis not present

## 2019-04-23 DIAGNOSIS — L8990 Pressure ulcer of unspecified site, unspecified stage: Secondary | ICD-10-CM | POA: Insufficient documentation

## 2019-04-23 DIAGNOSIS — K219 Gastro-esophageal reflux disease without esophagitis: Secondary | ICD-10-CM | POA: Diagnosis present

## 2019-04-23 DIAGNOSIS — R748 Abnormal levels of other serum enzymes: Secondary | ICD-10-CM | POA: Diagnosis not present

## 2019-04-23 DIAGNOSIS — F32A Depression, unspecified: Secondary | ICD-10-CM | POA: Diagnosis present

## 2019-04-23 DIAGNOSIS — R000 Tachycardia, unspecified: Secondary | ICD-10-CM | POA: Diagnosis not present

## 2019-04-23 LAB — COMPREHENSIVE METABOLIC PANEL
ALT: 36 U/L (ref 0–44)
AST: 38 U/L (ref 15–41)
Albumin: 3.6 g/dL (ref 3.5–5.0)
Alkaline Phosphatase: 77 U/L (ref 38–126)
Anion gap: 12 (ref 5–15)
BUN: 13 mg/dL (ref 8–23)
CO2: 24 mmol/L (ref 22–32)
Calcium: 8.1 mg/dL — ABNORMAL LOW (ref 8.9–10.3)
Chloride: 101 mmol/L (ref 98–111)
Creatinine, Ser: 0.71 mg/dL (ref 0.44–1.00)
GFR calc Af Amer: >60 mL/min (ref >60)
GFR calc non Af Amer: >60 mL/min (ref >60)
Glucose, Bld: 108 mg/dL — ABNORMAL HIGH (ref 70–99)
Potassium: 3.7 mmol/L (ref 3.5–5.1)
Sodium: 137 mmol/L (ref 135–145)
Total Bilirubin: 0.9 mg/dL (ref 0.3–1.2)
Total Protein: 6.6 g/dL (ref 6.5–8.1)

## 2019-04-23 LAB — LACTIC ACID, PLASMA: Lactic Acid, Venous: 0.9 mmol/L (ref 0.5–1.9)

## 2019-04-23 LAB — CBC WITH DIFFERENTIAL/PLATELET
Abs Immature Granulocytes: 0.02 10*3/uL (ref 0.00–0.07)
Basophils Absolute: 0 10*3/uL (ref 0.0–0.1)
Basophils Relative: 0 %
Eosinophils Absolute: 0 10*3/uL (ref 0.0–0.5)
Eosinophils Relative: 0 %
HCT: 40.7 % (ref 36.0–46.0)
Hemoglobin: 13.8 g/dL (ref 12.0–15.0)
Immature Granulocytes: 1 %
Lymphocytes Relative: 15 %
Lymphs Abs: 0.6 10*3/uL — ABNORMAL LOW (ref 0.7–4.0)
MCH: 30.3 pg (ref 26.0–34.0)
MCHC: 33.9 g/dL (ref 30.0–36.0)
MCV: 89.3 fL (ref 80.0–100.0)
Monocytes Absolute: 0.2 10*3/uL (ref 0.1–1.0)
Monocytes Relative: 5 %
Neutro Abs: 3.2 10*3/uL (ref 1.7–7.7)
Neutrophils Relative %: 79 %
Platelets: 135 10*3/uL — ABNORMAL LOW (ref 150–400)
RBC: 4.56 MIL/uL (ref 3.87–5.11)
RDW: 13.4 % (ref 11.5–15.5)
WBC: 4.1 10*3/uL (ref 4.0–10.5)
nRBC: 0 % (ref 0.0–0.2)

## 2019-04-23 LAB — D-DIMER, QUANTITATIVE: D-Dimer, Quant: 0.68 ug/mL-FEU — ABNORMAL HIGH (ref 0.00–0.50)

## 2019-04-23 NOTE — ED Provider Notes (Signed)
[facility] EMERGENCY DEPARTMENT Provider Note   CSN: [id number] Arrival date & time: 04/23/19  2139     History Chief Complaint  Patient presents with  . Shortness of Breath    Covid +    Lauren Peters is a 64 y.o. female presenting for evaluation of shortness of breath.  Patient states 1 week ago she started develop Covid-like symptoms.  She developed cough, nasal congestion, sinus pressure, and generalized body aches.  She was tested on January 6, test came back positive on the seventh.  She reports today she started to feel short of breath.  Shortness of breath is worse with exertion, talking, or any activity.  She denies known fevers, however has been taking Tylenol and ibuprofen around-the-clock for body aches and headaches.  She denies history of lung problems such as asthma or COPD.  She denies tobacco use.  She does not wear oxygen normally.  She denies chest pain, nausea vomiting, abdominal pain, urinary symptoms, abnormal bowel movements.  HPI     Past Medical History:  Diagnosis Date  . Acid reflux   . Anxiety   . Depression   . History of kidney stones     There are no problems to display for this patient.   Past Surgical History:  Procedure Laterality Date  . ABDOMINAL HYSTERECTOMY     over 30 years ago  . EXTRACORPOREAL SHOCK WAVE LITHOTRIPSY Left 08/23/2018   Procedure: EXTRACORPOREAL SHOCK WAVE LITHOTRIPSY (ESWL);  Surgeon: Franchot Gallo, MD;  Location: [facility];  Service: Urology;  Laterality: Left;  . HAND SURGERY Right    x2 on right hand, carpal tunnel, bone removed  . LITHOTRIPSY  2007  . REDUCTION MAMMAPLASTY     20 years ago     OB History   No obstetric history on file.     Family History  Problem Relation Age of Onset  . Breast cancer Other     Social History   Tobacco Use  . Smoking status: Never Smoker  . Smokeless tobacco: Never Used  Substance Use Topics  . Alcohol use: Yes    Comment: 2x week  . Drug use: Never      Home Medications Prior to Admission medications   Medication Sig Start Date End Date Taking? Authorizing Provider  buPROPion (WELLBUTRIN) 75 MG tablet Take 75 mg by mouth daily.    [provider]  cephALEXin (KEFLEX) 500 MG capsule Take 500 mg by mouth 2 (two) times daily.    [provider]  HYDROcodone-acetaminophen (NORCO/VICODIN) 5-325 MG tablet Take 1-2 tablets by mouth every 4 (four) hours as needed for moderate pain.    [provider]  naproxen sodium (ANAPROX) 220 MG tablet Take 220 mg by mouth daily as needed (pain).    [provider]  omeprazole (PRILOSEC) 40 MG capsule Take 1 capsule by mouth daily. 06/24/16   [provider]  ondansetron (ZOFRAN) 4 MG tablet Take 4 mg by mouth every 8 (eight) hours as needed for nausea or vomiting.    [provider]  tamsulosin (FLOMAX) 0.4 MG CAPS capsule Take 0.4 mg by mouth.    [provider]    Allergies    Albuterol and Sulfamethoxazole  Review of Systems   Review of Systems  HENT: Positive for congestion.   Respiratory: Positive for cough and shortness of breath.   All other systems reviewed and are negative.   Physical Exam Updated Vital Signs BP (!) 173/92 (BP Location:  Left Arm)   Pulse (!) 107   Temp 99 F (37.2 C) (Oral)   Resp (!) 28   Ht 5' (1.524 m)   Wt 70.3 kg   SpO2 100%   BMI 30.27 kg/m   Physical Exam Vitals and nursing note reviewed.  Constitutional:      General: She is not in acute distress.    Appearance: She is well-developed.     Comments: Nontoxic on my evaluation  HENT:     Head: Normocephalic and atraumatic.  Eyes:     Conjunctiva/sclera: Conjunctivae normal.     Pupils: Pupils are equal, round, and reactive to light.  Cardiovascular:     Rate and Rhythm: Normal rate and regular rhythm.     Pulses: Normal pulses.  Pulmonary:     Effort: Pulmonary effort is normal. Tachypnea present. No respiratory distress.     Breath  sounds: No wheezing.     Comments: Speaking full sentences on oxygen (patient states she was speaking in short sentences without oxygen).  Sats upper 90s on 2 L.  No signs of respiratory distress or accessory muscle use.  Tachypneic in the 20s. crackles heard in bilateral lower bases.  Abdominal:     General: There is no distension.     Palpations: Abdomen is soft. There is no mass.     Tenderness: There is no abdominal tenderness. There is no guarding or rebound.  Musculoskeletal:        General: Normal range of motion.     Cervical back: Normal range of motion and neck supple.     Right lower leg: No edema.     Left lower leg: No edema.  Skin:    General: Skin is warm and dry.     Capillary Refill: Capillary refill takes less than 2 seconds.  Neurological:     Mental Status: She is alert and oriented to person, place, and time.     ED Results / Procedures / Treatments   Labs (all labs ordered are listed, but only abnormal results are displayed) Labs Reviewed  CBC WITH DIFFERENTIAL/PLATELET - Abnormal; Notable for the following components:      Result Value   Platelets 135 (*)    Lymphs Abs 0.6 (*)    All other components within normal limits  COMPREHENSIVE METABOLIC PANEL - Abnormal; Notable for the following components:   Glucose, Bld 108 (*)    Calcium 8.1 (*)    All other components within normal limits  D-DIMER, QUANTITATIVE (NOT AT [facility]) - Abnormal; Notable for the following components:   D-Dimer, Quant 0.68 (*)    All other components within normal limits  CULTURE, BLOOD (ROUTINE X 2)  CULTURE, BLOOD (ROUTINE X 2)  LACTIC ACID, PLASMA  LACTIC ACID, PLASMA  PROCALCITONIN  LACTATE DEHYDROGENASE  FERRITIN  TRIGLYCERIDES  FIBRINOGEN  C-REACTIVE PROTEIN    EKG EKG Interpretation  Date/Time:  Saturday April 23 2019 22:29:29 EST Ventricular Rate:  100 PR Interval:    QRS Duration: 84 QT Interval:  345 QTC Calculation: 445 R Axis:   4 Text  Interpretation: Sinus tachycardia Since last tracing rate faster Confirmed by Isla Pence [phone]) on 04/23/2019 10:35:04 PM   Radiology DG Chest Port 1 View  Result Date: 04/23/2019 CLINICAL DATA:  Shortness of breath with COVID-19. EXAM: PORTABLE CHEST 1 VIEW COMPARISON:  April 08, 2016 FINDINGS: There is scattered airspace opacities bilaterally. The heart size is borderline enlarged. There is no pneumothorax. No large pleural effusion. There  is no acute osseous abnormality. IMPRESSION: Scattered bilateral airspace opacities consistent with the patient's history of viral pneumonia. Electronically Signed   By: Constance Holster M.D.   On: 04/23/2019 23:04    Procedures .Critical Care Performed by: Franchot Heidelberg, PA-C Authorized by: Franchot Heidelberg, PA-C   Critical care provider statement:    Critical care time (minutes):  35   Critical care time was exclusive of:  Separately billable procedures and treating other patients and teaching time   Critical care was necessary to treat or prevent imminent or life-threatening deterioration of the following conditions:  Respiratory failure   Critical care was time spent personally by me on the following activities:  Blood draw for specimens, development of treatment plan with patient or surrogate, evaluation of patient's response to treatment, examination of patient, obtaining history from patient or surrogate, ordering and performing treatments and interventions, ordering and review of laboratory studies, pulse oximetry, ordering and review of radiographic studies, re-evaluation of patient's condition and review of old charts   I assumed direction of critical care for this patient from another provider in my specialty: no   Comments:     Patient with tachypnea and hypoxia requiring oxygen.  She will require hospitalization for Covid.   (including critical care time)  Medications Ordered in ED Medications - No data to display  ED Course   I have reviewed the triage vital signs and the nursing notes.  Pertinent labs & imaging results that were available during my care of the patient were reviewed by me and considered in my medical decision making (see chart for details).    MDM Rules/Calculators/A&P                      Patient resenting for evaluation of shortness of breath.  Physical exam shows patient who is nontoxic on oxygen.  However, which is normal incision, patient is speaking in short sentences.  With minimal activity such as moving from wheelchair to bed or with talking, patient sats dropped to the 80s.  In the setting of a positive Covid diagnosis, concern for worsening respiratory status.  Patient without chest pain, doubt PE.  Will obtain labs, x-ray, EKG.  Patient will likely need to be admitted as she is now on oxygen. Case discussed with attending, Dr. Gilford Raid evaluated the pt.   Labs overall reassuring.  No leukocytosis.  Electrolytes stable.  X-ray viewed interpreted by me, shows bilateral opacities consistent with Covid.  Patient remains mildly tachycardic and tachypneic, but sat stable on 2 L.  Will call for admission.  Discussed with Posey Pronto from [facility] service, pt to be admitted.   Final Clinical Impression(s) / ED Diagnoses Final diagnoses:  COVID-19  Pneumonia due to COVID-19 virus  Hypoxia    Rx / DC Orders ED Discharge Orders    None       Franchot Heidelberg, PA-C 04/23/19 2346    Isla Pence, MD 04/24/19 1505

## 2019-04-23 NOTE — ED Notes (Signed)
ED Provider at bedside. 

## 2019-04-23 NOTE — ED Triage Notes (Signed)
Pt dx with covid on Jan 6th. Tonight presents with cough and SOB with exertion. O2 sats 87-88% on room air upon arrival to triage. Sats eventually increased to 93-94% at rest but drop with any activity into the 80s

## 2019-04-24 DIAGNOSIS — F32A Depression, unspecified: Secondary | ICD-10-CM | POA: Diagnosis present

## 2019-04-24 DIAGNOSIS — U071 COVID-19: Secondary | ICD-10-CM | POA: Diagnosis present

## 2019-04-24 DIAGNOSIS — F419 Anxiety disorder, unspecified: Secondary | ICD-10-CM | POA: Diagnosis present

## 2019-04-24 DIAGNOSIS — D696 Thrombocytopenia, unspecified: Secondary | ICD-10-CM | POA: Diagnosis present

## 2019-04-24 DIAGNOSIS — F329 Major depressive disorder, single episode, unspecified: Secondary | ICD-10-CM

## 2019-04-24 DIAGNOSIS — R0902 Hypoxemia: Secondary | ICD-10-CM | POA: Diagnosis present

## 2019-04-24 DIAGNOSIS — A4189 Other specified sepsis: Principal | ICD-10-CM

## 2019-04-24 DIAGNOSIS — J1282 Pneumonia due to coronavirus disease 2019: Secondary | ICD-10-CM

## 2019-04-24 LAB — CBC
HCT: 42.4 % (ref 36.0–46.0)
Hemoglobin: 14.2 g/dL (ref 12.0–15.0)
MCH: 29.7 pg (ref 26.0–34.0)
MCHC: 33.5 g/dL (ref 30.0–36.0)
MCV: 88.7 fL (ref 80.0–100.0)
Platelets: 158 10*3/uL (ref 150–400)
RBC: 4.78 MIL/uL (ref 3.87–5.11)
RDW: 13.4 % (ref 11.5–15.5)
WBC: 3.3 10*3/uL — ABNORMAL LOW (ref 4.0–10.5)
nRBC: 0 % (ref 0.0–0.2)

## 2019-04-24 LAB — CREATININE, SERUM
Creatinine, Ser: 0.68 mg/dL (ref 0.44–1.00)
GFR calc Af Amer: >60 mL/min (ref >60)
GFR calc non Af Amer: >60 mL/min (ref >60)

## 2019-04-24 LAB — LACTATE DEHYDROGENASE: LDH: 295 U/L — ABNORMAL HIGH (ref 98–192)

## 2019-04-24 LAB — HIV ANTIBODY (ROUTINE TESTING W REFLEX): HIV Screen 4th Generation wRfx: NONREACTIVE

## 2019-04-24 LAB — FIBRINOGEN: Fibrinogen: 439 mg/dL (ref 210–475)

## 2019-04-24 LAB — FERRITIN: Ferritin: 978 ng/mL — ABNORMAL HIGH (ref 11–307)

## 2019-04-24 LAB — C-REACTIVE PROTEIN: CRP: 8.3 mg/dL — ABNORMAL HIGH (ref <1.0)

## 2019-04-24 LAB — GLUCOSE, CAPILLARY
Glucose-Capillary: 107 mg/dL — ABNORMAL HIGH (ref 70–99)
Glucose-Capillary: 140 mg/dL — ABNORMAL HIGH (ref 70–99)

## 2019-04-24 LAB — TRIGLYCERIDES: Triglycerides: 57 mg/dL (ref <150)

## 2019-04-24 LAB — PROCALCITONIN: Procalcitonin: 0.11 ng/mL

## 2019-04-24 LAB — ABO/RH: ABO/RH(D): O NEG

## 2019-04-24 MED ORDER — CLONAZEPAM 0.5 MG PO TABS: 0.5000 mg | ORAL_TABLET | Freq: Every day | ORAL | Status: DC | PRN

## 2019-04-24 MED ORDER — HYDROCODONE-ACETAMINOPHEN 5-325 MG PO TABS
1.0000 | ORAL_TABLET | ORAL | Status: DC | PRN
  Administered 2019-04-24 – 2019-04-28 (×3): 1 via ORAL
  Filled 2019-04-24 (×4): qty 1

## 2019-04-24 MED ORDER — SODIUM CHLORIDE 0.9 % IV SOLN
200.0000 mg | Freq: Once | INTRAVENOUS | Status: AC
  Administered 2019-04-24: 13:00:00 200 mg via INTRAVENOUS
  Filled 2019-04-24: qty 200

## 2019-04-24 MED ORDER — ALBUTEROL SULFATE HFA 108 (90 BASE) MCG/ACT IN AERS: 2.0000 | INHALATION_SPRAY | Freq: Four times a day (QID) | RESPIRATORY_TRACT | Status: DC

## 2019-04-24 MED ORDER — IPRATROPIUM BROMIDE HFA 17 MCG/ACT IN AERS
2.0000 | INHALATION_SPRAY | Freq: Three times a day (TID) | RESPIRATORY_TRACT | Status: DC
  Administered 2019-04-24 – 2019-05-01 (×22): 2 via RESPIRATORY_TRACT
  Filled 2019-04-24 (×2): qty 12.9

## 2019-04-24 MED ORDER — GUAIFENESIN-DM 100-10 MG/5ML PO SYRP
10.0000 mL | ORAL_SOLUTION | ORAL | Status: DC | PRN
  Administered 2019-04-24 – 2019-04-30 (×11): 10 mL via ORAL
  Filled 2019-04-24 (×12): qty 10

## 2019-04-24 MED ORDER — PANTOPRAZOLE SODIUM 40 MG PO TBEC
40.0000 mg | DELAYED_RELEASE_TABLET | Freq: Every day | ORAL | Status: DC
  Administered 2019-04-24 – 2019-05-01 (×8): 40 mg via ORAL
  Filled 2019-04-24 (×8): qty 1

## 2019-04-24 MED ORDER — SODIUM CHLORIDE 0.9 % IV SOLN
100.0000 mg | Freq: Every day | INTRAVENOUS | Status: AC
  Administered 2019-04-25 – 2019-04-28 (×4): 100 mg via INTRAVENOUS
  Filled 2019-04-24 (×3): qty 100
  Filled 2019-04-24: qty 20

## 2019-04-24 MED ORDER — ACETAMINOPHEN 325 MG PO TABS
650.0000 mg | ORAL_TABLET | Freq: Four times a day (QID) | ORAL | Status: DC | PRN
  Administered 2019-04-24: 650 mg via ORAL
  Filled 2019-04-24: qty 2

## 2019-04-24 MED ORDER — HYDROCOD POLST-CPM POLST ER 10-8 MG/5ML PO SUER
5.0000 mL | Freq: Two times a day (BID) | ORAL | Status: DC | PRN
  Administered 2019-04-25 – 2019-05-01 (×10): 5 mL via ORAL
  Filled 2019-04-24 (×10): qty 5

## 2019-04-24 MED ORDER — SENNA 8.6 MG PO TABS
1.0000 | ORAL_TABLET | Freq: Two times a day (BID) | ORAL | Status: DC
  Administered 2019-04-24 – 2019-05-01 (×12): 8.6 mg via ORAL
  Filled 2019-04-24 (×14): qty 1

## 2019-04-24 MED ORDER — ACETAMINOPHEN 325 MG PO TABS
650.0000 mg | ORAL_TABLET | Freq: Four times a day (QID) | ORAL | Status: DC | PRN
  Administered 2019-04-24 – 2019-04-26 (×3): 650 mg via ORAL
  Filled 2019-04-24 (×4): qty 2

## 2019-04-24 MED ORDER — ZINC SULFATE 220 (50 ZN) MG PO CAPS
220.0000 mg | ORAL_CAPSULE | Freq: Every day | ORAL | Status: DC
  Administered 2019-04-24 – 2019-05-01 (×8): 220 mg via ORAL
  Filled 2019-04-24 (×8): qty 1

## 2019-04-24 MED ORDER — TAMSULOSIN HCL 0.4 MG PO CAPS: 0.4000 mg | ORAL_CAPSULE | Freq: Every day | ORAL | Status: DC

## 2019-04-24 MED ORDER — BUPROPION HCL 75 MG PO TABS
75.0000 mg | ORAL_TABLET | Freq: Every day | ORAL | Status: DC
  Administered 2019-04-24: 75 mg via ORAL
  Filled 2019-04-24 (×2): qty 1

## 2019-04-24 MED ORDER — SODIUM CHLORIDE 0.9% FLUSH
3.0000 mL | Freq: Two times a day (BID) | INTRAVENOUS | Status: DC
  Administered 2019-04-24 – 2019-05-01 (×13): 3 mL via INTRAVENOUS

## 2019-04-24 MED ORDER — DOCUSATE SODIUM 100 MG PO CAPS
100.0000 mg | ORAL_CAPSULE | Freq: Two times a day (BID) | ORAL | Status: DC
  Administered 2019-04-24 – 2019-05-01 (×12): 100 mg via ORAL
  Filled 2019-04-24 (×14): qty 1

## 2019-04-24 MED ORDER — ASCORBIC ACID 500 MG PO TABS
500.0000 mg | ORAL_TABLET | Freq: Every day | ORAL | Status: DC
  Administered 2019-04-24 – 2019-05-01 (×8): 500 mg via ORAL
  Filled 2019-04-24 (×8): qty 1

## 2019-04-24 MED ORDER — BENZONATATE 100 MG PO CAPS
100.0000 mg | ORAL_CAPSULE | Freq: Three times a day (TID) | ORAL | Status: DC
  Administered 2019-04-24 – 2019-05-01 (×21): 100 mg via ORAL
  Filled 2019-04-24 (×21): qty 1

## 2019-04-24 MED ORDER — DEXAMETHASONE SODIUM PHOSPHATE 10 MG/ML IJ SOLN
6.0000 mg | Freq: Once | INTRAMUSCULAR | Status: AC
  Administered 2019-04-24: 6 mg via INTRAVENOUS
  Filled 2019-04-24: qty 1

## 2019-04-24 MED ORDER — POTASSIUM CHLORIDE IN NACL 20-0.9 MEQ/L-% IV SOLN
INTRAVENOUS | Status: DC
  Filled 2019-04-24 (×2): qty 1000

## 2019-04-24 MED ORDER — DEXAMETHASONE 4 MG PO TABS
6.0000 mg | ORAL_TABLET | ORAL | Status: DC
  Administered 2019-04-25: 6 mg via ORAL
  Filled 2019-04-24: qty 2

## 2019-04-24 MED ORDER — ENOXAPARIN SODIUM 40 MG/0.4ML ~~LOC~~ SOLN
40.0000 mg | SUBCUTANEOUS | Status: DC
  Administered 2019-04-24 – 2019-04-30 (×7): 40 mg via SUBCUTANEOUS
  Filled 2019-04-24 (×7): qty 0.4

## 2019-04-24 NOTE — Progress Notes (Signed)
I encouraged patient to prone and explained the rationale and importance. She refused to prone because she said she "wouldn't be able to tolerate it", but was agreeable to laying on her side. I helped her position on her right side, and encouraged her to be on her side as much as possible. All possessions and needed items within reach

## 2019-04-24 NOTE — ED Notes (Signed)
No need for second Lactate per EDP.

## 2019-04-24 NOTE — ED Notes (Signed)
Spoke with Otila Kluver, RN to notify that per [facility] pt will not be transported before 0700. Otila Kluver states she will give the report that was given to her by this RN to the oncoming floor RN. This facility will call with updates regarding pt condition and transport.

## 2019-04-24 NOTE — ED Notes (Signed)
Patient on 2L O2. Asleep.

## 2019-04-24 NOTE — ED Notes (Signed)
[facility] in to transport @ 0945 called to cancel [facility] spoke to [employer]

## 2019-04-24 NOTE — Plan of Care (Signed)

## 2019-04-24 NOTE — Progress Notes (Signed)
Pt arrived via stretcher with 2 EMT personnel, not in distress. Alert and oriented x 4. All needs met. [facility] admissions is paged.

## 2019-04-24 NOTE — H&P (Signed)
History and Physical    JOZLIN SCHUTH [id number] DOB: 30-Jul-1955 DOA: 04/23/2019  PCP: Kathyrn Lass, MD  Patient coming from: [facility]  I have personally briefly reviewed patient's old medical records in [place]  Chief Complaint: worsening SOB  HPI: Lauren Peters is a 64 y.o. female with medical history significant of GERD and anxiety, who started to have Covid symptoms last Saturday (6 days ago) then tested covid positive this past Wednesday (4 days ago) as outpatient, who presented to [facility] yesterday with worsening SOB, associated with fever, chills, cough, body aches. No loss of taste or smell. No chest pain, abdominal pain or diarrhea. She lost appetite and felt generalized weakness. Yesterday she was so SOB to a point that she could not even carry any normal conversation. She does not smoke or routinely drink alcohol. She has no underlying cardiopulmonary diseases. She's been exposed to a friend who recently tested Covid positive as well.   ED Course:  Tm 100.4, HR 95-123, RR 18-33, Bp 141/90-173/92, she's hypoxic with 86% O2 on RA, and currently on 2L [place] with normal O2 sat. Normal CMP and CBC. CRP 8.3, ferritin 978, procalcitonin 0.11 normal, lactic acid 0.9, normal; D-dimer 0.68. CXR showed scattered bilateral airspace opacities bilaterally c/w viral pneumonia. Given her covid pneumonia with sepsis and hypoxia, pt was transferred from [facility] to [facility] for further management.   Review of Systems: As per HPI otherwise 10 point review of systems negative.     Past Medical History:  Diagnosis Date  . Acid reflux   . Anxiety   . Depression   . History of kidney stones     Past Surgical History:  Procedure Laterality Date  . ABDOMINAL HYSTERECTOMY     over 30 years ago  . EXTRACORPOREAL SHOCK WAVE LITHOTRIPSY Left 08/23/2018   Procedure: EXTRACORPOREAL SHOCK WAVE LITHOTRIPSY (ESWL);  Surgeon: Franchot Gallo, MD;  Location: [facility];  Service: Urology;  Laterality: Left;  . HAND  SURGERY Right    x2 on right hand, carpal tunnel, bone removed  . LITHOTRIPSY  2007  . REDUCTION MAMMAPLASTY     20 years ago     reports that she has never smoked. She has never used smokeless tobacco. She reports current alcohol use. She reports that she does not use drugs.  Allergies  Allergen Reactions  . Albuterol Other (See Comments)    Hot flashes/ flush/   . Sulfamethoxazole Rash    Family History  Problem Relation Age of Onset  . Breast cancer Other       Prior to Admission medications   Medication Sig Start Date End Date Taking? Authorizing Provider  acetaminophen (TYLENOL) 325 MG tablet Take 325 mg by mouth every 6 (six) hours as needed for mild pain or headache.   Yes [provider]  benzonatate (TESSALON) 100 MG capsule Take 100 mg by mouth 3 (three) times daily. 04/23/19  Yes [provider]  buPROPion (WELLBUTRIN) 75 MG tablet Take 75 mg by mouth 2 (two) times daily.    Yes [provider]  naproxen sodium (ANAPROX) 220 MG tablet Take 220 mg by mouth daily as needed (pain).   Yes [provider]  omeprazole (PRILOSEC) 40 MG capsule Take 40 mg by mouth daily.  06/24/16  Yes [provider]  ondansetron (ZOFRAN) 4 MG tablet Take 4 mg by mouth every 8 (eight) hours as needed for nausea or vomiting.   Yes [provider]    Physical Exam:  Vitals:   04/24/19 0606 04/24/19 0617 04/24/19 0900 04/24/19 1035  BP:  (!) 150/88  (!) 158/96  Pulse:  (!) 104 (!) 102 (!) 105  Resp:  (!) 27 (!) 23 20  Temp: (!) 100.4 F (38 C)   98.3 F (36.8 C)  TempSrc:    Oral  SpO2:  95% 96% 99%  Weight:      Height:        Constitutional: NAD, calm, comfortable Vitals:   04/24/19 0606 04/24/19 0617 04/24/19 0900 04/24/19 1035  BP:  (!) 150/88  (!) 158/96  Pulse:  (!) 104 (!) 102 (!) 105  Resp:  (!) 27 (!) 23 20  Temp: (!) 100.4 F (38 C)   98.3 F (36.8 C)  TempSrc:    Oral  SpO2:  95% 96% 99%  Weight:      Height:        Eyes: PERRL, lids and conjunctivae normal ENMT: Mucous membranes are moist. Posterior pharynx clear of any exudate or lesions.Normal dentition.  Neck: normal, supple, no masses, no thyromegaly Respiratory: decreased breath sound bilaterally, with crackles. Normal respiratory effort. No accessory muscle use.  Cardiovascular: Regular rhythm but tachycardia, no murmurs / rubs / gallops. No extremity edema. 2+ pedal pulses. No carotid bruits.  Abdomen: no tenderness, no masses palpated. No hepatosplenomegaly. Bowel sounds positive.  Musculoskeletal: no clubbing / cyanosis. No joint deformity upper and lower extremities. Good ROM, no contractures. Normal muscle tone.  Skin: no rashes, lesions, ulcers. No induration Neurologic: CN 2-12 grossly intact. Sensation intact, DTR normal. Strength 5/5 in all 4.  Psychiatric: Normal judgment and insight. Alert and oriented x 3. Normal mood.     (Make sure to document decubitus ulcers present on admission -- if possible -- and whether patient has chronic indwelling catheter at time of admission)  Labs on Admission: I have personally reviewed following labs and imaging studies  CBC: Recent Labs  Lab 04/23/19 2219  WBC 4.1  NEUTROABS 3.2  HGB 13.8  HCT 40.7  MCV 89.3  PLT [id number]*   Basic Metabolic Panel: Recent Labs  Lab 04/23/19 2219  NA 137  K 3.7  CL 101  CO2 24  GLUCOSE 108*  BUN 13  CREATININE 0.71  CALCIUM 8.1*   GFR: Estimated Creatinine Clearance: 63 mL/min (by C-G formula based on SCr of 0.71 mg/dL). Liver Function Tests: Recent Labs  Lab 04/23/19 2219  AST 38  ALT 36  ALKPHOS 77  BILITOT 0.9  PROT 6.6  ALBUMIN 3.6   No results for input(s): LIPASE, AMYLASE in the last 168 hours. No results for input(s): AMMONIA in the last 168 hours. Coagulation Profile: No results for input(s): INR, PROTIME in the last 168 hours. Cardiac Enzymes: No results for input(s): CKTOTAL, CKMB, CKMBINDEX, TROPONINI in the last 168 hours.  BNP (last 3 results) No results for input(s): PROBNP in the last 8760 hours. HbA1C: No results for input(s): HGBA1C in the last 72 hours. CBG: Recent Labs  Lab 04/24/19 1128  GLUCAP 107*   Lipid Profile: Recent Labs    04/23/19 2219  TRIG 57   Thyroid Function Tests: No results for input(s): TSH, T4TOTAL, FREET4, T3FREE, THYROIDAB in the last 72 hours. Anemia Panel: Recent Labs    04/23/19 2223  FERRITIN 978*   Urine analysis:    Component Value Date/Time   COLORURINE YELLOW 08/19/2018 0917   APPEARANCEUR HAZY (A) 08/19/2018 0917   LABSPEC 1.018 08/19/2018 0917   PHURINE 5.0 08/19/2018 [id number]  GLUCOSEU NEGATIVE 08/19/2018 [place] (A) 08/19/2018 [place] 08/19/2018 [place] 08/19/2018 0917   PROTEINUR NEGATIVE 08/19/2018 0917   NITRITE NEGATIVE 08/19/2018 0917   LEUKOCYTESUR MODERATE (A) 08/19/2018 0917    Radiological Exams on Admission: DG Chest Port 1 View  Result Date: 04/23/2019 CLINICAL DATA:  Shortness of breath with COVID-19. EXAM: PORTABLE CHEST 1 VIEW COMPARISON:  April 08, 2016 FINDINGS: There is scattered airspace opacities bilaterally. The heart size is borderline enlarged. There is no pneumothorax. No large pleural effusion. There is no acute osseous abnormality. IMPRESSION: Scattered bilateral airspace opacities consistent with the patient's history of viral pneumonia. Electronically Signed   By: Constance Holster M.D.   On: 04/23/2019 23:04    EKG: Independently reviewed.   Assessment/Plan Principal Problem:   Pneumonia due to COVID-19 virus Active Problems:   Sepsis due to COVID-19 ([place])   Hypoxia   Thrombocytopenia (HCC)   Anxiety and depression    Plan: - admit to med-tele monitoring bed due to meeting sepsis criteria - blood culture sent, pending result - as procalcitonin normal, no need to add on extra antibiotics for pneumonia - for Covid pneumonia with sepsis, start with iv Remdesivir  (5 day course) and po decadron (10 day course), PRN nebs and cough meds, other supportive care as well, daily monitoring inflammatory markers - mildly elevated D-dimer, no suspicion for PE now, but if hypoxia gets worse despite Covid treatment, consider checking with CTPA to rule out PE - respiratory team to evaluate and determine O2 need - mild thrombocytopenia may be due to viral infection. Will monitor CBC - resume home anxiety medication  DVT prophylaxis: lovenox  Code Status: full code  Family Communication: no family available to discuss Disposition Plan: home when clinically improving and completion of Remdesivir Consults called: none Admission status: tele  Coding: total 70 min on this encounter with >50% of time on direct patient care and plan formulation.    Paticia Stack MD [facility] Pager [phone]  If 7PM-7AM, please contact night-coverage [email or web address] Password [facility]  04/24/2019, 11:36 AM

## 2019-04-24 NOTE — ED Notes (Signed)
Notified [facility] [phone] to transport pt spoke to [place]

## 2019-04-25 DIAGNOSIS — J9601 Acute respiratory failure with hypoxia: Secondary | ICD-10-CM

## 2019-04-25 DIAGNOSIS — L8990 Pressure ulcer of unspecified site, unspecified stage: Secondary | ICD-10-CM | POA: Insufficient documentation

## 2019-04-25 DIAGNOSIS — L89321 Pressure ulcer of left buttock, stage 1: Secondary | ICD-10-CM

## 2019-04-25 LAB — COMPREHENSIVE METABOLIC PANEL
ALT: 43 U/L (ref 0–44)
AST: 50 U/L — ABNORMAL HIGH (ref 15–41)
Albumin: 3 g/dL — ABNORMAL LOW (ref 3.5–5.0)
Alkaline Phosphatase: 68 U/L (ref 38–126)
Anion gap: 14 (ref 5–15)
BUN: 15 mg/dL (ref 8–23)
CO2: 23 mmol/L (ref 22–32)
Calcium: 7.9 mg/dL — ABNORMAL LOW (ref 8.9–10.3)
Chloride: 105 mmol/L (ref 98–111)
Creatinine, Ser: 0.71 mg/dL (ref 0.44–1.00)
GFR calc Af Amer: >60 mL/min (ref >60)
GFR calc non Af Amer: >60 mL/min (ref >60)
Glucose, Bld: 128 mg/dL — ABNORMAL HIGH (ref 70–99)
Potassium: 4.4 mmol/L (ref 3.5–5.1)
Sodium: 142 mmol/L (ref 135–145)
Total Bilirubin: 1 mg/dL (ref 0.3–1.2)
Total Protein: 6.2 g/dL — ABNORMAL LOW (ref 6.5–8.1)

## 2019-04-25 LAB — CBC WITH DIFFERENTIAL/PLATELET
Abs Immature Granulocytes: 0.03 10*3/uL (ref 0.00–0.07)
Basophils Absolute: 0 10*3/uL (ref 0.0–0.1)
Basophils Relative: 0 %
Eosinophils Absolute: 0 10*3/uL (ref 0.0–0.5)
Eosinophils Relative: 0 %
HCT: 41.4 % (ref 36.0–46.0)
Hemoglobin: 14 g/dL (ref 12.0–15.0)
Immature Granulocytes: 1 %
Lymphocytes Relative: 14 %
Lymphs Abs: 0.5 10*3/uL — ABNORMAL LOW (ref 0.7–4.0)
MCH: 29.9 pg (ref 26.0–34.0)
MCHC: 33.8 g/dL (ref 30.0–36.0)
MCV: 88.3 fL (ref 80.0–100.0)
Monocytes Absolute: 0.3 10*3/uL (ref 0.1–1.0)
Monocytes Relative: 7 %
Neutro Abs: 2.7 10*3/uL (ref 1.7–7.7)
Neutrophils Relative %: 78 %
Platelets: 165 10*3/uL (ref 150–400)
RBC: 4.69 MIL/uL (ref 3.87–5.11)
RDW: 13.2 % (ref 11.5–15.5)
WBC: 3.5 10*3/uL — ABNORMAL LOW (ref 4.0–10.5)
nRBC: 0 % (ref 0.0–0.2)

## 2019-04-25 LAB — FERRITIN: Ferritin: 886 ng/mL — ABNORMAL HIGH (ref 11–307)

## 2019-04-25 LAB — C-REACTIVE PROTEIN: CRP: 4.6 mg/dL — ABNORMAL HIGH (ref <1.0)

## 2019-04-25 LAB — D-DIMER, QUANTITATIVE: D-Dimer, Quant: 0.76 ug/mL-FEU — ABNORMAL HIGH (ref 0.00–0.50)

## 2019-04-25 MED ORDER — DEXAMETHASONE 4 MG PO TABS
8.0000 mg | ORAL_TABLET | ORAL | Status: DC
  Administered 2019-04-26: 8 mg via ORAL
  Filled 2019-04-25: qty 2

## 2019-04-25 MED ORDER — TOCILIZUMAB 400 MG/20ML IV SOLN
600.0000 mg | Freq: Once | INTRAVENOUS | Status: AC
  Administered 2019-04-25: 600 mg via INTRAVENOUS
  Filled 2019-04-25: qty 20

## 2019-04-25 MED ORDER — BUPROPION HCL 75 MG PO TABS
75.0000 mg | ORAL_TABLET | Freq: Two times a day (BID) | ORAL | Status: DC
  Administered 2019-04-25 – 2019-05-01 (×12): 75 mg via ORAL
  Filled 2019-04-25 (×14): qty 1

## 2019-04-25 MED ORDER — TOCILIZUMAB 400 MG/20ML IV SOLN
8.0000 mg/kg | Freq: Once | INTRAVENOUS | Status: DC
  Filled 2019-04-25: qty 28.1

## 2019-04-25 NOTE — Progress Notes (Signed)
PROGRESS NOTE  Lauren Peters [id number] DOB: 31-Oct-1955   PCP: Kathyrn Lass, MD  Patient is from: Home.  Independent for ADLs and IADLs at baseline.  DOA: 04/23/2019 LOS: 2  Brief Narrative / Interim history: 64 year old female with history of anxiety, depression and GERD presented to [facility] ED with worsening shortness of breath, fever, chills, cough and myalgia for 6 days.  She tested positive for COVID-19 on 04/20/2019.  In ED, febrile to 100.4.  Slightly tachycardic.  RR 18-33.  Slightly hypertensive.  Desaturated to 86% on RA requiring 2 L.  CBC and CMP normal.  CRP 8.3.  Pro-Cal and LA within normal range.  CXR with scattered bilateral airspace opacities.  Admitted for acute respiratory failure with hypoxia due to COVID-19 infection.  Started on remdesivir and Decadron.  Subjective: No major events overnight of this morning.  Reports improvement in her symptoms but still with shortness of breath and cough.  Denies GI symptoms or UTI.  Denies chest pain except when she coughs.  Objective: Vitals:   04/24/19 1240 04/24/19 2123 04/25/19 0500 04/25/19 1230  BP: 131/82 (!) 145/92 127/67 122/76  Pulse: (!) 101 (!) 102 80 79  Resp: 19 19  20   Temp: 99.8 F (37.7 C) 99.4 F (37.4 C) 99.6 F (37.6 C) 99.4 F (37.4 C)  TempSrc: Oral Oral Oral Oral  SpO2: 99% 95% 97% 97%  Weight:      Height:        Intake/Output Summary (Last 24 hours) at 04/25/2019 1338 Last data filed at 04/24/2019 1917 Gross per 24 hour  Intake 820.83 ml  Output 1000 ml  Net -179.17 ml   Filed Weights   04/23/19 2159  Weight: 70.3 kg    Examination:  GENERAL: No acute distress.  Appears well.  HEENT: MMM.  Vision and hearing grossly intact.  NECK: Supple.  No apparent JVD.  RESP: Desaturated to 86% on RA when she sat up for exam.  Diminished aeration bilaterally. CVS:  RRR. Heart sounds normal.  ABD/GI/GU: Bowel sounds present. Soft. Non tender.  MSK/EXT:  Moves extremities. No apparent deformity. No  edema.  SKIN: no apparent skin lesion or wound NEURO: Awake, alert and oriented appropriately.  No apparent focal neuro deficit. PSYCH: Calm. Normal affect.   Procedures:  None  Assessment & Plan: Acute respiratory failure with hypoxia due to COVID-19 pneumonia: symptomatic for 6 days prior to admission.  Tested positive on 1/6.  Desaturated to 86% on RA.  CXR with bilateral opacities.  CRP 8.3 on admission.  Pro-Cal and lactic acid negative.  Blood cultures negative.  Continues to require 2 L to maintain saturation in low nineties.  Desaturated to 86% which she sat up for exam. Recent Labs    04/23/19 2219 04/23/19 2223 04/25/19 0408  DDIMER 0.68*  --  0.76*  FERRITIN  --  978* 886*  LDH 295*  --   --   CRP  --  8.3* 4.6*  - Actemra.  Obtained verbal consent after risk-benefit and off label use discussion.  No contraindications. -Continue remdesivir and Decadron 1/10> increase Decadron to 8 mg -Subcu Lovenox for VTE prophylaxis. -Supportive care with inhalers, mucolytic/antitussive, vitamins and incentive spirometry. -OOB and proning as able. -Wean oxygen as able. -Monitor inflammatory markers.  Anxiety/depression: Stable -Continue Wellbutrin-might not be a great choice for anxiety but is stable.  GERD: -PPI  Pressure Injury 04/24/19 Buttocks Right;Left Stage 1 -  Intact skin with non-blanchable redness of a localized area usually over  a bony prominence. Reddness (Active)  04/24/19 1025  Location: Buttocks  Location Orientation: Right;Left  Staging: Stage 1 -  Intact skin with non-blanchable redness of a localized area usually over a bony prominence.  Wound Description (Comments): Reddness  Present on Admission: Yes              DVT prophylaxis: Subcu Lovenox Code Status: Full code Family Communication: Updated patient's daughter with patient's permission. Disposition Plan: Remains inpatient-continues to require supplemental oxygen to maintain appropriate  saturation Consultants: None   Microbiology summarized: 1/6-COVID-19 positive. Blood cultures negative.  Sch Meds:  Scheduled Meds: . vitamin C  500 mg Oral Daily  . benzonatate  100 mg Oral TID  . buPROPion  75 mg Oral BID  . [START ON 04/26/2019] dexamethasone  8 mg Oral Q24H  . docusate sodium  100 mg Oral BID  . enoxaparin (LOVENOX) injection  40 mg Subcutaneous Q24H  . ipratropium  2 puff Inhalation TID  . pantoprazole  40 mg Oral Daily  . senna  1 tablet Oral BID  . sodium chloride flush  3 mL Intravenous Q12H  . zinc sulfate  220 mg Oral Daily   Continuous Infusions: . remdesivir 100 mg in NS 100 mL 100 mg (04/25/19 0950)  . tocilizumab (ACTEMRA) - non-COVID treatment 600 mg (04/25/19 1308)   PRN Meds:.acetaminophen, chlorpheniramine-HYDROcodone, guaiFENesin-dextromethorphan, HYDROcodone-acetaminophen  Antimicrobials: Anti-infectives (From admission, onward)   Start     Dose/Rate Route Frequency Ordered Stop   04/25/19 1000  remdesivir 100 mg in sodium chloride 0.9 % 100 mL IVPB     100 mg 200 mL/hr over 30 Minutes Intravenous Daily 04/24/19 1100 04/29/19 0959   04/24/19 1200  remdesivir 200 mg in sodium chloride 0.9% 250 mL IVPB     200 mg 580 mL/hr over 30 Minutes Intravenous Once 04/24/19 1100 04/24/19 1900       I have personally reviewed the following labs and images: CBC: Recent Labs  Lab 04/23/19 2219 04/24/19 1155 04/25/19 0408  WBC 4.1 3.3* 3.5*  NEUTROABS 3.2  --  2.7  HGB 13.8 14.2 14.0  HCT 40.7 42.4 41.4  MCV 89.3 88.7 88.3  PLT 135* 158 165   BMP &GFR Recent Labs  Lab 04/23/19 2219 04/24/19 1155 04/25/19 0408  NA 137  --  142  K 3.7  --  4.4  CL 101  --  105  CO2 24  --  23  GLUCOSE 108*  --  128*  BUN 13  --  15  CREATININE 0.71 0.68 0.71  CALCIUM 8.1*  --  7.9*   Estimated Creatinine Clearance: 63 mL/min (by C-G formula based on SCr of 0.71 mg/dL). Liver & Pancreas: Recent Labs  Lab 04/23/19 2219 04/25/19 0408  AST 38  50*  ALT 36 43  ALKPHOS 77 68  BILITOT 0.9 1.0  PROT 6.6 6.2*  ALBUMIN 3.6 3.0*   No results for input(s): LIPASE, AMYLASE in the last 168 hours. No results for input(s): AMMONIA in the last 168 hours. Diabetic: No results for input(s): HGBA1C in the last 72 hours. Recent Labs  Lab 04/24/19 1128 04/24/19 1618  GLUCAP 107* 140*   Cardiac Enzymes: No results for input(s): CKTOTAL, CKMB, CKMBINDEX, TROPONINI in the last 168 hours. No results for input(s): PROBNP in the last 8760 hours. Coagulation Profile: No results for input(s): INR, PROTIME in the last 168 hours. Thyroid Function Tests: No results for input(s): TSH, T4TOTAL, FREET4, T3FREE, THYROIDAB in the last 72 hours. Lipid  Profile: Recent Labs    04/23/19 2219  TRIG 57   Anemia Panel: Recent Labs    04/23/19 2223 04/25/19 0408  FERRITIN 978* 886*   Urine analysis:    Component Value Date/Time   COLORURINE YELLOW 08/19/2018 0917   APPEARANCEUR HAZY (A) 08/19/2018 0917   LABSPEC 1.018 08/19/2018 0917   PHURINE 5.0 08/19/2018 0917   GLUCOSEU NEGATIVE 08/19/2018 0917   HGBUR LARGE (A) 08/19/2018 [place] 08/19/2018 [place] 08/19/2018 0917   PROTEINUR NEGATIVE 08/19/2018 0917   NITRITE NEGATIVE 08/19/2018 0917   LEUKOCYTESUR MODERATE (A) 08/19/2018 0917   Sepsis Labs: Invalid input(s): PROCALCITONIN, [place]  Microbiology: Recent Results (from the past 240 hour(s))  Novel Coronavirus, NAA ([facility])     Status: Abnormal   Collection Time: 04/20/19  9:55 AM   Specimen: Nasopharyngeal(NP) swabs in vial transport medium   NASOPHARYNGE  TESTING  Result Value Ref Range Status   SARS-CoV-2, NAA Detected (A) Not Detected Final    Comment: This nucleic acid amplification test was developed and its performance characteristics determined by [employer]. Nucleic acid amplification tests include PCR and TMA. This test has not been FDA cleared or approved. This test  has been authorized by FDA under an Emergency Use Authorization (EUA). This test is only authorized for the duration of time the declaration that circumstances exist justifying the authorization of the emergency use of in vitro diagnostic tests for detection of SARS-CoV-2 virus and/or diagnosis of COVID-19 infection under section 564(b)(1) of the Act, 21 U.S.C. [id number]) (1), unless the authorization is terminated or revoked sooner. When diagnostic testing is negative, the possibility of a false negative result should be considered in the context of a patient's recent exposures and the presence of clinical signs and symptoms consistent with COVID-19. An individual without symptoms of COVID-19 and who is not shedding SARS-CoV-2 virus would  expect to have a negative (not detected) result in this assay.   Blood Culture (routine x 2)     Status: None (Preliminary result)   Collection Time: 04/23/19 10:35 PM   Specimen: BLOOD LEFT ARM  Result Value Ref Range Status   Specimen Description   Final    BLOOD LEFT ARM Performed at [facility], [address] [place], [place] [address]    Special Requests   Final    BOTTLES DRAWN AEROBIC AND ANAEROBIC Blood Culture adequate volume Performed at [facility], [address], [place], [place] [address]    Culture   Final    NO GROWTH 1 DAY Performed at [facility], [place] [address], [place], [place] [address]    Report Status PENDING  Incomplete  Blood Culture (routine x 2)     Status: None (Preliminary result)   Collection Time: 04/23/19 10:45 PM   Specimen: BLOOD RIGHT FOREARM  Result Value Ref Range Status   Specimen Description   Final    BLOOD RIGHT FOREARM Performed at [facility], [address] [place], [place] [address]    Special Requests   Final    BOTTLES DRAWN AEROBIC AND ANAEROBIC Blood Culture adequate volume Performed at [facility], [address], [place],  [place] [address]    Culture   Final    NO GROWTH 1 DAY Performed at [facility], [place] [address] [place], [place] [address]    Report Status PENDING  Incomplete  Radiology Studies: No results found.  35 minutes with more than 50% spent in reviewing records, counseling patient/family and coordinating care.   Annalysia Willenbring T. Hercules  If 7PM-7AM, please contact night-coverage [email or web address] Password [facility] 04/25/2019, 1:38 PM

## 2019-04-26 DIAGNOSIS — R531 Weakness: Secondary | ICD-10-CM

## 2019-04-26 LAB — CBC WITH DIFFERENTIAL/PLATELET
Abs Immature Granulocytes: 0.06 10*3/uL (ref 0.00–0.07)
Basophils Absolute: 0 10*3/uL (ref 0.0–0.1)
Basophils Relative: 1 %
Eosinophils Absolute: 0 10*3/uL (ref 0.0–0.5)
Eosinophils Relative: 0 %
HCT: 43.6 % (ref 36.0–46.0)
Hemoglobin: 14.1 g/dL (ref 12.0–15.0)
Immature Granulocytes: 1 %
Lymphocytes Relative: 16 %
Lymphs Abs: 0.7 10*3/uL (ref 0.7–4.0)
MCH: 29.7 pg (ref 26.0–34.0)
MCHC: 32.3 g/dL (ref 30.0–36.0)
MCV: 92 fL (ref 80.0–100.0)
Monocytes Absolute: 0.4 10*3/uL (ref 0.1–1.0)
Monocytes Relative: 9 %
Neutro Abs: 3.3 10*3/uL (ref 1.7–7.7)
Neutrophils Relative %: 73 %
Platelets: 220 10*3/uL (ref 150–400)
RBC: 4.74 MIL/uL (ref 3.87–5.11)
RDW: 13.2 % (ref 11.5–15.5)
WBC: 4.5 10*3/uL (ref 4.0–10.5)
nRBC: 0 % (ref 0.0–0.2)

## 2019-04-26 LAB — COMPREHENSIVE METABOLIC PANEL
ALT: 43 U/L (ref 0–44)
AST: 41 U/L (ref 15–41)
Albumin: 3.1 g/dL — ABNORMAL LOW (ref 3.5–5.0)
Alkaline Phosphatase: 66 U/L (ref 38–126)
Anion gap: 9 (ref 5–15)
BUN: 19 mg/dL (ref 8–23)
CO2: 25 mmol/L (ref 22–32)
Calcium: 8.4 mg/dL — ABNORMAL LOW (ref 8.9–10.3)
Chloride: 105 mmol/L (ref 98–111)
Creatinine, Ser: 0.56 mg/dL (ref 0.44–1.00)
GFR calc Af Amer: >60 mL/min (ref >60)
GFR calc non Af Amer: >60 mL/min (ref >60)
Glucose, Bld: 126 mg/dL — ABNORMAL HIGH (ref 70–99)
Potassium: 4.3 mmol/L (ref 3.5–5.1)
Sodium: 139 mmol/L (ref 135–145)
Total Bilirubin: 0.8 mg/dL (ref 0.3–1.2)
Total Protein: 6.1 g/dL — ABNORMAL LOW (ref 6.5–8.1)

## 2019-04-26 LAB — D-DIMER, QUANTITATIVE: D-Dimer, Quant: 0.75 ug/mL-FEU — ABNORMAL HIGH (ref 0.00–0.50)

## 2019-04-26 LAB — C-REACTIVE PROTEIN: CRP: 1 mg/dL — ABNORMAL HIGH (ref <1.0)

## 2019-04-26 LAB — FERRITIN: Ferritin: 837 ng/mL — ABNORMAL HIGH (ref 11–307)

## 2019-04-26 LAB — GLUCOSE, CAPILLARY: Glucose-Capillary: 125 mg/dL — ABNORMAL HIGH (ref 70–99)

## 2019-04-26 MED ORDER — METHYLPREDNISOLONE SODIUM SUCC 40 MG IJ SOLR
40.0000 mg | Freq: Two times a day (BID) | INTRAMUSCULAR | Status: DC
  Administered 2019-04-26 – 2019-05-01 (×10): 40 mg via INTRAVENOUS
  Filled 2019-04-26 (×10): qty 1

## 2019-04-26 MED ORDER — ASCORBIC ACID 500 MG PO TABS: 500.0000 mg | ORAL_TABLET | Freq: Every day | ORAL | 0 refills | Status: DC

## 2019-04-26 MED ORDER — ZINC SULFATE 220 (50 ZN) MG PO CAPS: 220.0000 mg | ORAL_CAPSULE | Freq: Every day | ORAL | 0 refills | Status: DC

## 2019-04-26 MED ORDER — ONDANSETRON HCL 4 MG/2ML IJ SOLN
4.0000 mg | Freq: Four times a day (QID) | INTRAMUSCULAR | Status: DC | PRN
  Administered 2019-04-26 – 2019-04-30 (×2): 4 mg via INTRAVENOUS
  Filled 2019-04-26 (×2): qty 2

## 2019-04-26 MED ORDER — HYDROCOD POLST-CPM POLST ER 10-8 MG/5ML PO SUER: 5.0000 mL | Freq: Two times a day (BID) | ORAL | 0 refills | Status: DC | PRN

## 2019-04-26 NOTE — Progress Notes (Signed)
PROGRESS NOTE  EMMARY HECKLE [id number] DOB: 1955-09-04   PCP: Kathyrn Lass, MD  Patient is from: Home.  Independent for ADLs and IADLs at baseline.  DOA: 04/23/2019 LOS: 3  Brief Narrative / Interim history: 64 year old female with history of anxiety, depression and GERD presented to [facility] ED with worsening shortness of breath, fever, chills, cough and myalgia for 6 days.  She tested positive for COVID-19 on 04/20/2019.  In ED, febrile to 100.4.  Slightly tachycardic.  RR 18-33.  Slightly hypertensive.  Desaturated to 86% on RA requiring 2 L.  CBC and CMP normal.  CRP 8.3.  Pro-Cal and LA within normal range.  CXR with scattered bilateral airspace opacities.  Admitted for acute respiratory failure with hypoxia due to COVID-19 infection.  Started on remdesivir and Decadron.   Patient received Actemra on 1/11.   Subjective: No major events overnight of this morning.  She states she does not feel good.  Still with significant shortness of breath and cough.  Reports nausea.  Denies chest pain, emesis, abdominal pain, diarrhea or UTI symptoms.  Objective: Vitals:   04/25/19 0500 04/25/19 1230 04/25/19 2147 04/26/19 0600  BP: 127/67 122/76 (!) 132/94 122/70  Pulse: 80 79 78 77  Resp:  20 19 20   Temp: 99.6 F (37.6 C) 99.4 F (37.4 C) 99.5 F (37.5 C) 97.8 F (36.6 C)  TempSrc: Oral Oral Oral Oral  SpO2: 97% 97% 94% 96%  Weight:      Height:        Intake/Output Summary (Last 24 hours) at 04/26/2019 1535 Last data filed at 04/26/2019 1500 Gross per 24 hour  Intake 400 ml  Output 480 ml  Net -80 ml   Filed Weights   04/23/19 2159  Weight: 70.3 kg    Examination:  GENERAL: No apparent distress but looks tired. HEENT: MMM.  Vision and hearing grossly intact.  NECK: Supple.  No apparent JVD.  RESP: Desaturated to 86% on RA.  No IWOB.  Diminished aeration bilaterally probably due to poor inspiratory effort. CVS:  RRR. Heart sounds normal.  ABD/GI/GU: Bowel sounds present.  Soft. Non tender.  MSK/EXT:  Moves extremities. No apparent deformity. No edema.  SKIN: no apparent skin lesion or wound NEURO: Awake, alert and oriented appropriately.  No apparent focal neuro deficit. PSYCH: Calm. Normal affect.  Procedures:  None  Assessment & Plan: Acute respiratory failure with hypoxia due to COVID-19 pneumonia: symptomatic for 6 days prior to admission.  Tested positive on 1/6.  Desaturated to 86% on RA.  CXR with bilateral opacities.  CRP 8.3 on admission.  Pro-Cal and lactic acid negative.  Blood cultures negative.  Continues to require 2 L to maintain saturation in low nineties.  Desaturated to 86% which she sat up for exam. Recent Labs    04/23/19 2219 04/23/19 2223 04/25/19 0408 04/26/19 0422  DDIMER 0.68*  --  0.76* 0.75*  FERRITIN  --  978* 886* 837*  LDH 295*  --   --   --   CRP  --  8.3* 4.6* 1.0*  -Actemra.  Obtained verbal consent after risk-benefit and off label use discussion.  No contraindications. -Continue remdesivir and Decadron 1/10-1/12.  Solu-Medrol 1/12>> -Subcu Lovenox for VTE prophylaxis. -Supportive care with inhalers, mucolytic/antitussive, vitamins and incentive spirometry. -OOB and proning as able. -Wean oxygen as able. -Monitor inflammatory markers.  Anxiety/depression: Stable -Continue Wellbutrin-might not be a great choice for anxiety but is stable.  GERD: -PPI   Generalized weakness -PT/OT eval.  Pressure injury of right and left buttocks: stage I. POA Pressure Injury 04/24/19 Buttocks Right;Left Stage 1 -  Intact skin with non-blanchable redness of a localized area usually over a bony prominence. Reddness (Active)  04/24/19 1025  Location: Buttocks  Location Orientation: Right;Left  Staging: Stage 1 -  Intact skin with non-blanchable redness of a localized area usually over a bony prominence.  Wound Description (Comments): Reddness  Present on Admission: Yes              DVT prophylaxis: Subcu Lovenox Code  Status: Full code Family Communication: Updated patient's daughter with patient's permission. Disposition Plan: Remains inpatient due to respiratory distress and oxygen requirement Consultants: None   Microbiology summarized: 1/6-COVID-19 positive. Blood cultures negative.  Sch Meds:  Scheduled Meds: . vitamin C  500 mg Oral Daily  . benzonatate  100 mg Oral TID  . buPROPion  75 mg Oral BID  . dexamethasone  8 mg Oral Q24H  . docusate sodium  100 mg Oral BID  . enoxaparin (LOVENOX) injection  40 mg Subcutaneous Q24H  . ipratropium  2 puff Inhalation TID  . pantoprazole  40 mg Oral Daily  . senna  1 tablet Oral BID  . sodium chloride flush  3 mL Intravenous Q12H  . zinc sulfate  220 mg Oral Daily   Continuous Infusions: . remdesivir 100 mg in NS 100 mL 100 mg (04/26/19 0918)   PRN Meds:.acetaminophen, chlorpheniramine-HYDROcodone, guaiFENesin-dextromethorphan, HYDROcodone-acetaminophen, ondansetron (ZOFRAN) IV  Antimicrobials: Anti-infectives (From admission, onward)   Start     Dose/Rate Route Frequency Ordered Stop   04/25/19 1000  remdesivir 100 mg in sodium chloride 0.9 % 100 mL IVPB     100 mg 200 mL/hr over 30 Minutes Intravenous Daily 04/24/19 1100 04/29/19 0959   04/24/19 1200  remdesivir 200 mg in sodium chloride 0.9% 250 mL IVPB     200 mg 580 mL/hr over 30 Minutes Intravenous Once 04/24/19 1100 04/24/19 1900       I have personally reviewed the following labs and images: CBC: Recent Labs  Lab 04/23/19 2219 04/24/19 1155 04/25/19 0408 04/26/19 0422  WBC 4.1 3.3* 3.5* 4.5  NEUTROABS 3.2  --  2.7 3.3  HGB 13.8 14.2 14.0 14.1  HCT 40.7 42.4 41.4 43.6  MCV 89.3 88.7 88.3 92.0  PLT 135* 158 165 220   BMP &GFR Recent Labs  Lab 04/23/19 2219 04/24/19 1155 04/25/19 0408 04/26/19 0422  NA 137  --  142 139  K 3.7  --  4.4 4.3  CL 101  --  105 105  CO2 24  --  23 25  GLUCOSE 108*  --  128* 126*  BUN 13  --  15 19  CREATININE 0.71 0.68 0.71 0.56    CALCIUM 8.1*  --  7.9* 8.4*   Estimated Creatinine Clearance: 63 mL/min (by C-G formula based on SCr of 0.56 mg/dL). Liver & Pancreas: Recent Labs  Lab 04/23/19 2219 04/25/19 0408 04/26/19 0422  AST 38 50* 41  ALT 36 43 43  ALKPHOS 77 68 66  BILITOT 0.9 1.0 0.8  PROT 6.6 6.2* 6.1*  ALBUMIN 3.6 3.0* 3.1*   No results for input(s): LIPASE, AMYLASE in the last 168 hours. No results for input(s): AMMONIA in the last 168 hours. Diabetic: No results for input(s): HGBA1C in the last 72 hours. Recent Labs  Lab 04/24/19 1128 04/24/19 1618 04/26/19 0802  GLUCAP 107* 140* 125*   Cardiac Enzymes: No results for input(s): CKTOTAL, CKMB,  CKMBINDEX, TROPONINI in the last 168 hours. No results for input(s): PROBNP in the last 8760 hours. Coagulation Profile: No results for input(s): INR, PROTIME in the last 168 hours. Thyroid Function Tests: No results for input(s): TSH, T4TOTAL, FREET4, T3FREE, THYROIDAB in the last 72 hours. Lipid Profile: Recent Labs    04/23/19 2219  TRIG 57   Anemia Panel: Recent Labs    04/25/19 0408 04/26/19 0422  FERRITIN 886* 837*   Urine analysis:    Component Value Date/Time   COLORURINE YELLOW 08/19/2018 0917   APPEARANCEUR HAZY (A) 08/19/2018 0917   LABSPEC 1.018 08/19/2018 0917   PHURINE 5.0 08/19/2018 0917   GLUCOSEU NEGATIVE 08/19/2018 0917   HGBUR LARGE (A) 08/19/2018 [place] 08/19/2018 [place] 08/19/2018 0917   PROTEINUR NEGATIVE 08/19/2018 0917   NITRITE NEGATIVE 08/19/2018 0917   LEUKOCYTESUR MODERATE (A) 08/19/2018 0917   Sepsis Labs: Invalid input(s): PROCALCITONIN, [place]  Microbiology: Recent Results (from the past 240 hour(s))  Novel Coronavirus, NAA ([facility])     Status: Abnormal   Collection Time: 04/20/19  9:55 AM   Specimen: Nasopharyngeal(NP) swabs in vial transport medium   NASOPHARYNGE  TESTING  Result Value Ref Range Status   SARS-CoV-2, NAA Detected (A) Not Detected  Final    Comment: This nucleic acid amplification test was developed and its performance characteristics determined by [employer]. Nucleic acid amplification tests include PCR and TMA. This test has not been FDA cleared or approved. This test has been authorized by FDA under an Emergency Use Authorization (EUA). This test is only authorized for the duration of time the declaration that circumstances exist justifying the authorization of the emergency use of in vitro diagnostic tests for detection of SARS-CoV-2 virus and/or diagnosis of COVID-19 infection under section 564(b)(1) of the Act, 21 U.S.C. [id number]) (1), unless the authorization is terminated or revoked sooner. When diagnostic testing is negative, the possibility of a false negative result should be considered in the context of a patient's recent exposures and the presence of clinical signs and symptoms consistent with COVID-19. An individual without symptoms of COVID-19 and who is not shedding SARS-CoV-2 virus would  expect to have a negative (not detected) result in this assay.   Blood Culture (routine x 2)     Status: None (Preliminary result)   Collection Time: 04/23/19 10:35 PM   Specimen: BLOOD LEFT ARM  Result Value Ref Range Status   Specimen Description   Final    BLOOD LEFT ARM Performed at [facility], [address] [place], [place] [address]    Special Requests   Final    BOTTLES DRAWN AEROBIC AND ANAEROBIC Blood Culture adequate volume Performed at [facility], [address] [place], [place] [address]    Culture   Final    NO GROWTH 2 DAYS Performed at [facility], [place] [address], [place], [place] [address]    Report Status PENDING  Incomplete  Blood Culture (routine x 2)     Status: None (Preliminary result)   Collection Time: 04/23/19 10:45 PM   Specimen: BLOOD RIGHT FOREARM  Result Value Ref Range Status   Specimen Description   Final    BLOOD RIGHT  FOREARM Performed at [facility], [address] [place], [place] [address]    Special Requests   Final    BOTTLES DRAWN AEROBIC AND ANAEROBIC Blood Culture adequate volume Performed at [facility]  [address] [place], [place] [address]    Culture   Final    NO GROWTH 2 DAYS Performed at [facility], [place] [address], [place], [place] [address]    Report Status PENDING  Incomplete    Radiology Studies: No results found.   Gizelle Whetsel T. [address]  If 7PM-7AM, please contact night-coverage [email or web address] Password [facility] 04/26/2019, 3:35 PM

## 2019-04-26 NOTE — Progress Notes (Deleted)
Patient scheduled for outpatient Remdesivir infusion at 10AM on Thursday 1/14  Please advise them to report to [facility] at [address].  Drive to the security guard and tell them you are here for an infusion. They will direct you to the front entrance where we will come and get you.  For questions call [phone].  Thanks

## 2019-04-26 NOTE — Discharge Instructions (Addendum)
Person Under Monitoring Name: Lauren Peters  Location: [place] [address]   Infection Prevention Recommendations for Individuals Confirmed to have, or Being Evaluated for, 2019 Novel Coronavirus (COVID-19) Infection Who Receive Care at Home  Individuals who are confirmed to have, or are being evaluated for, COVID-19 should follow the prevention steps below until a healthcare provider or local or state health department says they can return to normal activities.  Stay home except to get medical care You should restrict activities outside your home, except for getting medical care. Do not go to work, school, or public areas, and do not use public transportation or taxis.  Call ahead before visiting your doctor Before your medical appointment, call the healthcare provider and tell them that you have, or are being evaluated for, COVID-19 infection. This will help the healthcare provider's office take steps to keep other people from getting infected. Ask your healthcare provider to call the local or state health department.  Monitor your symptoms Seek prompt medical attention if your illness is worsening (e.g., difficulty breathing). Before going to your medical appointment, call the healthcare provider and tell them that you have, or are being evaluated for, COVID-19 infection. Ask your healthcare provider to call the local or state health department.  Wear a facemask You should wear a facemask that covers your nose and mouth when you are in the same room with other people and when you visit a healthcare provider. People who live with or visit you should also wear a facemask while they are in the same room with you.  Separate yourself from other people in your home As much as possible, you should stay in a different room from other people in your home. Also, you should use a separate bathroom, if available.  Avoid sharing household items You should not share  dishes, drinking glasses, cups, eating utensils, towels, bedding, or other items with other people in your home. After using these items, you should wash them thoroughly with soap and water.  Cover your coughs and sneezes Cover your mouth and nose with a tissue when you cough or sneeze, or you can cough or sneeze into your sleeve. Throw used tissues in a lined trash can, and immediately wash your hands with soap and water for at least 20 seconds or use an alcohol-based hand rub.  Wash your [facility] your hands often and thoroughly with soap and water for at least 20 seconds. You can use an alcohol-based hand sanitizer if soap and water are not available and if your hands are not visibly dirty. Avoid touching your eyes, nose, and mouth with unwashed hands.   Prevention Steps for Caregivers and Household Members of Individuals Confirmed to have, or Being Evaluated for, COVID-19 Infection Being Cared for in the Home  If you live with, or provide care at home for, a person confirmed to have, or being evaluated for, COVID-19 infection please follow these guidelines to prevent infection:  Follow healthcare provider's instructions Make sure that you understand and can help the patient follow any healthcare provider instructions for all care.  Provide for the patient's basic needs You should help the patient with basic needs in the home and provide support for getting groceries, prescriptions, and other personal needs.  Monitor the patient's symptoms If they are getting sicker, call his or her medical provider and tell them that the patient has, or is being evaluated for, COVID-19 infection. This will help the healthcare provider's  office take steps to keep other people from getting infected. Ask the healthcare provider to call the local or state health department.  Limit the number of people who have contact with the patient  If possible, have only one caregiver for the patient.  Other  household members should stay in another home or place of residence. If this is not possible, they should stay  in another room, or be separated from the patient as much as possible. Use a separate bathroom, if available.  Restrict visitors who do not have an essential need to be in the home.  Keep older adults, very young children, and other sick people away from the patient Keep older adults, very young children, and those who have compromised immune systems or chronic health conditions away from the patient. This includes people with chronic heart, lung, or kidney conditions, diabetes, and cancer.  Ensure good ventilation Make sure that shared spaces in the home have good air flow, such as from an air conditioner or an opened window, weather permitting.  Wash your hands often  Wash your hands often and thoroughly with soap and water for at least 20 seconds. You can use an alcohol based hand sanitizer if soap and water are not available and if your hands are not visibly dirty.  Avoid touching your eyes, nose, and mouth with unwashed hands.  Use disposable paper towels to dry your hands. If not available, use dedicated cloth towels and replace them when they become wet.  Wear a facemask and gloves  Wear a disposable facemask at all times in the room and gloves when you touch or have contact with the patient's blood, body fluids, and/or secretions or excretions, such as sweat, saliva, sputum, nasal mucus, vomit, urine, or feces.  Ensure the mask fits over your nose and mouth tightly, and do not touch it during use.  Throw out disposable facemasks and gloves after using them. Do not reuse.  Wash your hands immediately after removing your facemask and gloves.  If your personal clothing becomes contaminated, carefully remove clothing and launder. Wash your hands after handling contaminated clothing.  Place all used disposable facemasks, gloves, and other waste in a lined container before  disposing them with other household waste.  Remove gloves and wash your hands immediately after handling these items.  Do not share dishes, glasses, or other household items with the patient  Avoid sharing household items. You should not share dishes, drinking glasses, cups, eating utensils, towels, bedding, or other items with a patient who is confirmed to have, or being evaluated for, COVID-19 infection.  After the person uses these items, you should wash them thoroughly with soap and water.  Wash laundry thoroughly  Immediately remove and wash clothes or bedding that have blood, body fluids, and/or secretions or excretions, such as sweat, saliva, sputum, nasal mucus, vomit, urine, or feces, on them.  Wear gloves when handling laundry from the patient.  Read and follow directions on labels of laundry or clothing items and detergent. In general, wash and dry with the warmest temperatures recommended on the label.  Clean all areas the individual has used often  Clean all touchable surfaces, such as counters, tabletops, doorknobs, bathroom fixtures, toilets, phones, keyboards, tablets, and bedside tables, every day. Also, clean any surfaces that may have blood, body fluids, and/or secretions or excretions on them.  Wear gloves when cleaning surfaces the patient has come in contact with.  Use a diluted bleach solution (e.g., dilute bleach with 1  part bleach and 10 parts water) or a household disinfectant with a label that says EPA-registered for coronaviruses. To make a bleach solution at home, add 1 tablespoon of bleach to 1 quart (4 cups) of water. For a larger supply, add  cup of bleach to 1 gallon (16 cups) of water.  Read labels of cleaning products and follow recommendations provided on product labels. Labels contain instructions for safe and effective use of the cleaning product including precautions you should take when applying the product, such as wearing gloves or eye protection  and making sure you have good ventilation during use of the product.  Remove gloves and wash hands immediately after cleaning.  Monitor yourself for signs and symptoms of illness Caregivers and household members are considered close contacts, should monitor their health, and will be asked to limit movement outside of the home to the extent possible. Follow the monitoring steps for close contacts listed on the symptom monitoring form.   ? If you have additional questions, contact your local health department or call the epidemiologist on call at [phone] (available 24/7). ? This guidance is subject to change. For the most up-to-date guidance from [facility], please refer to their website: [email or web address]

## 2019-04-26 NOTE — Progress Notes (Signed)
Patient c/o nausea and at the same time HR appeared tachycardic at different rates (120s, 150s, 200s) on the cardiac monitor.  Patient was fanning herself d/t nausea during the tachycardic events and it did seem this was causing the heart monitor to erroneously read as tachycardic, because HR came back down once she stopped fanning herself. EKG performed and was NSR with normal rate and rhythm.   Patient stable at this time.  Will continue to monitor.

## 2019-04-27 ENCOUNTER — Ambulatory Visit (HOSPITAL_COMMUNITY): Payer: BC Managed Care – PPO

## 2019-04-27 ENCOUNTER — Inpatient Hospital Stay (HOSPITAL_COMMUNITY): Payer: BC Managed Care – PPO

## 2019-04-27 DIAGNOSIS — R748 Abnormal levels of other serum enzymes: Secondary | ICD-10-CM

## 2019-04-27 LAB — CBC WITH DIFFERENTIAL/PLATELET
Abs Immature Granulocytes: 0.11 10*3/uL — ABNORMAL HIGH (ref 0.00–0.07)
Basophils Absolute: 0 10*3/uL (ref 0.0–0.1)
Basophils Relative: 1 %
Eosinophils Absolute: 0 10*3/uL (ref 0.0–0.5)
Eosinophils Relative: 0 %
HCT: 42.6 % (ref 36.0–46.0)
Hemoglobin: 14 g/dL (ref 12.0–15.0)
Immature Granulocytes: 2 %
Lymphocytes Relative: 13 %
Lymphs Abs: 0.6 10*3/uL — ABNORMAL LOW (ref 0.7–4.0)
MCH: 29.6 pg (ref 26.0–34.0)
MCHC: 32.9 g/dL (ref 30.0–36.0)
MCV: 90.1 fL (ref 80.0–100.0)
Monocytes Absolute: 0.4 10*3/uL (ref 0.1–1.0)
Monocytes Relative: 9 %
Neutro Abs: 3.8 10*3/uL (ref 1.7–7.7)
Neutrophils Relative %: 75 %
Platelets: 259 10*3/uL (ref 150–400)
RBC: 4.73 MIL/uL (ref 3.87–5.11)
RDW: 12.8 % (ref 11.5–15.5)
WBC: 5 10*3/uL (ref 4.0–10.5)
nRBC: 0 % (ref 0.0–0.2)

## 2019-04-27 LAB — D-DIMER, QUANTITATIVE: D-Dimer, Quant: 0.65 ug/mL-FEU — ABNORMAL HIGH (ref 0.00–0.50)

## 2019-04-27 LAB — COMPREHENSIVE METABOLIC PANEL
ALT: 79 U/L — ABNORMAL HIGH (ref 0–44)
AST: 54 U/L — ABNORMAL HIGH (ref 15–41)
Albumin: 3 g/dL — ABNORMAL LOW (ref 3.5–5.0)
Alkaline Phosphatase: 66 U/L (ref 38–126)
Anion gap: 10 (ref 5–15)
BUN: 22 mg/dL (ref 8–23)
CO2: 27 mmol/L (ref 22–32)
Calcium: 8.2 mg/dL — ABNORMAL LOW (ref 8.9–10.3)
Chloride: 103 mmol/L (ref 98–111)
Creatinine, Ser: 0.62 mg/dL (ref 0.44–1.00)
GFR calc Af Amer: >60 mL/min (ref >60)
GFR calc non Af Amer: >60 mL/min (ref >60)
Glucose, Bld: 157 mg/dL — ABNORMAL HIGH (ref 70–99)
Potassium: 4.3 mmol/L (ref 3.5–5.1)
Sodium: 140 mmol/L (ref 135–145)
Total Bilirubin: 0.8 mg/dL (ref 0.3–1.2)
Total Protein: 5.8 g/dL — ABNORMAL LOW (ref 6.5–8.1)

## 2019-04-27 LAB — C-REACTIVE PROTEIN: CRP: 0.6 mg/dL (ref <1.0)

## 2019-04-27 LAB — GLUCOSE, CAPILLARY: Glucose-Capillary: 160 mg/dL — ABNORMAL HIGH (ref 70–99)

## 2019-04-27 LAB — FERRITIN: Ferritin: 886 ng/mL — ABNORMAL HIGH (ref 11–307)

## 2019-04-27 MED ORDER — METHYLPREDNISOLONE SODIUM SUCC 125 MG IJ SOLR: 125.0000 mg | Freq: Once | INTRAMUSCULAR | Status: DC | PRN

## 2019-04-27 MED ORDER — ALBUTEROL SULFATE HFA 108 (90 BASE) MCG/ACT IN AERS: 2.0000 | INHALATION_SPRAY | Freq: Once | RESPIRATORY_TRACT | Status: DC | PRN

## 2019-04-27 MED ORDER — FAMOTIDINE IN NACL 20-0.9 MG/50ML-% IV SOLN: 20.0000 mg | Freq: Once | INTRAVENOUS | Status: DC | PRN

## 2019-04-27 MED ORDER — SODIUM CHLORIDE 0.9 % IV SOLN: 100.0000 mg | Freq: Once | INTRAVENOUS | Status: DC

## 2019-04-27 MED ORDER — DIPHENHYDRAMINE HCL 50 MG/ML IJ SOLN: 50.0000 mg | Freq: Once | INTRAMUSCULAR | Status: DC | PRN

## 2019-04-27 MED ORDER — EPINEPHRINE 0.3 MG/0.3ML IJ SOAJ: 0.3000 mg | Freq: Once | INTRAMUSCULAR | Status: DC | PRN

## 2019-04-27 MED ORDER — SODIUM CHLORIDE 0.9 % IV SOLN: INTRAVENOUS | Status: DC | PRN

## 2019-04-27 NOTE — Progress Notes (Signed)
Patient admitted to the unit on 04/27/2019 at 1700. Patient ambulated to chair without distress on 4L [place]. Patient says she has all of her belongings after transfer. Patient's home medications sent to security while in the hospital and understands the education. Patient with no complaints and 0/10 pain. Patient oriented to the unit and to the room. Family aware of transfer. Family to bring contacts to the hospital.

## 2019-04-27 NOTE — Progress Notes (Signed)
Patient recently D/C'd with [facility] to be transferred to [facility]. O2 Sats stable on 4L/[place] upon D/C. Pt continues to deny any chest pain. [facility] receiving RN and pt's daughter made aware at time of transfer.

## 2019-04-27 NOTE — Evaluation (Addendum)
Physical Therapy Evaluation Patient Details Name: Lauren Peters MRN: [id number] DOB: June 29, 1955 Today's Date: 04/27/2019   History of Present Illness  64 year old female with history of anxiety, depression and GERD presented to [facility] ED with worsening shortness of breath, fever, chills, cough and myalgia for 6 days.  She tested positive for COVID-19 on 04/20/2019.  Clinical Impression  Pt admitted with above diagnosis. SaO2 87% on 4L O2 with supine to sit, 93% on 5L O2 while sitting edge of bed, HR 112 max in sitting. Min A to pivot to recliner. Pt is independent at baseline and has had a significant decline in activity tolerance. Pt required increased processing time, at times she did not respond to questions, other times she did no appropriately.  Pt stated she can DC to her boyfriend's home if she needs assistance upon DC. Pt currently with functional limitations due to the deficits listed below (see PT Problem List). Pt will benefit from skilled PT to increase their independence and safety with mobility to allow discharge to the venue listed below.       Follow Up Recommendations Home health PT    Equipment Recommendations  Rolling walker with 5" wheels    Recommendations for Other Services       Precautions / Restrictions Precautions Precautions: Other (comment) Precaution Comments: monitor O2, pt denies h/o falls in past year Restrictions Weight Bearing Restrictions: No      Mobility  Bed Mobility Overal bed mobility: Needs Assistance Bed Mobility: Supine to Sit     Supine to sit: Min assist     General bed mobility comments: min A to raise trunk; pt sat at edge of bed x ~10 minutes, with supine to sit SaO2 was 87% on 4L, 93% on 5L sitting at edge of bed, VCs for pursed lip breathing; HR 112 max while sitting, 2/4 dyspnea with frequent coughing while sitting  Transfers Overall transfer level: Needs assistance Equipment used: 2 person hand held assist Transfers: Sit to/from  [facility] Sit to Stand: Min guard Stand pivot transfers: Min guard;Min assist       General transfer comment: min/guard to min A for SPT  Ambulation/Gait                Stairs            Wheelchair Mobility    Modified Rankin (Stroke Patients Only)       Balance Overall balance assessment: Needs assistance Sitting-balance support: Feet supported;Bilateral upper extremity supported Sitting balance-Leahy Scale: Good       Standing balance-Leahy Scale: Fair                               Pertinent Vitals/Pain Pain Assessment: No/denies pain    Home Living Family/patient expects to be discharged to:: Private residence Living Arrangements: Alone Available Help at Discharge: Friend(s);Available 24 hours/day Type of Home: House       Home Layout: One level   Additional Comments: lives alone, but can stay at her boyfriend's home, he is available 50*    Prior Function Level of Independence: Independent         Comments: mowed her own lawn     Hand Dominance        Extremity/Trunk Assessment   Upper Extremity Assessment Upper Extremity Assessment: Defer to OT evaluation    Lower Extremity Assessment Lower Extremity Assessment: Overall WFL for tasks assessed    Cervical /  Trunk Assessment Cervical / Trunk Assessment: Normal  Communication   Communication: No difficulties  Cognition Arousal/Alertness: Awake/alert Behavior During Therapy: WFL for tasks assessed/performed Overall Cognitive Status: No family/caregiver present to determine baseline cognitive functioning                                 General Comments: increased processing time, pt did not respond to some questions      General Comments General comments (skin integrity, edema, etc.): initially on 4 liters, sats down to 87% with movement; bumped to 5 for transfer, and sats were 93%.  HR up to 112    Exercises General Exercises -  Lower Extremity Ankle Circles/Pumps: AROM;Both;10 reps;Supine Short Arc Quad: AROM;Both;5 reps;Supine   Assessment/Plan    PT Assessment Patient needs continued PT services  PT Problem List Decreased activity tolerance;Decreased knowledge of use of DME;Decreased mobility;Cardiopulmonary status limiting activity       PT Treatment Interventions DME instruction;Gait training;Functional mobility training;Therapeutic activities;Therapeutic exercise;Patient/family education;Balance training    PT Goals (Current goals can be found in the Care Plan section)  Acute Rehab PT Goals Patient Stated Goal: return to independence PT Goal Formulation: With patient Time For Goal Achievement: 05/11/19 Potential to Achieve Goals: Good    Frequency Min 3X/week   Barriers to discharge        Co-evaluation PT/OT/SLP Co-Evaluation/Treatment: Yes Reason for Co-Treatment: For patient/therapist safety PT goals addressed during session: Mobility/safety with mobility OT goals addressed during session: ADL's and self-care       AM-PAC PT "6 Clicks" Mobility  Outcome Measure Help needed turning from your back to your side while in a flat bed without using bedrails?: A Little Help needed moving from lying on your back to sitting on the side of a flat bed without using bedrails?: A Little Help needed moving to and from a bed to a chair (including a wheelchair)?: A Little Help needed standing up from a chair using your arms (e.g., wheelchair or bedside chair)?: A Little Help needed to walk in hospital room?: A Little Help needed climbing 3-5 steps with a railing? : A Lot 6 Click Score: 17    End of Session Equipment Utilized During Treatment: Oxygen Activity Tolerance: Patient limited by fatigue Patient left: in chair;with call bell/phone within reach;with chair alarm set Nurse Communication: Mobility status PT Visit Diagnosis: Difficulty in walking, not elsewhere classified (R26.2)    Time:  [id number] PT Time Calculation (min) (ACUTE ONLY): 23 min   Charges:   PT Evaluation $PT Eval Low Complexity: 1 Low         Philomena Doheny PT 04/27/2019  Acute Rehabilitation Services Pager [phone] Office [phone]

## 2019-04-27 NOTE — Progress Notes (Signed)
Patient's daughter, Lauren Peters, updated via phone on patient's status and transfer to [facility]. Pt now on 5L/[place] after PT assisted pt to chair. O2 Sats 95% at this time. Pt in no distress and states she feels better after getting OOB. MD Cyndia Skeeters paged to be made aware of increased O2 demands. Report called to RN Verita Lamb at [facility]. Awaiting [facility] at this time.

## 2019-04-27 NOTE — Progress Notes (Signed)
Pharmacy Medication Storage Note  Storing home medications for [place] in pharmacy secured storage.   Medication storage bag number: [id number]  Delivered to pharmacy @ 18:00 (time) by Ubaldo Glassing (RN name)  Medications will be returned to patient/caregiver upon discharge.  Peggyann Juba, PharmD, BCPS Pharmacy: [phone] 04/27/19 6:13 PM

## 2019-04-27 NOTE — Evaluation (Signed)
Occupational Therapy Evaluation Patient Details Name: Lauren Peters MRN: [id number] DOB: 06/29/1955 Today's Date: 04/27/2019    History of Present Illness 65 year old female with history of anxiety, depression and GERD presented to [facility] ED with worsening shortness of breath, fever, chills, cough and myalgia for 6 days.  She tested positive for COVID-19 on 04/20/2019.   Clinical Impression   Pt was admitted for the above. At baseline, she is independent with adls/iadls.  Pt's 02 drops with activities and she needs recovery time.  At time of eval, she was on 4 liters 02 and bumped to 5 for activity. Her boyfriend can provide support upon discharge. Pt needs min to mod A for adls at this time due to cardiopulmonary endurance.  Will follow in acute setting with supervision level goals. She may benefit from AE, and will further assess for DME needs.    Follow Up Recommendations  Home health OT;Supervision/Assistance - 24 hour    Equipment Recommendations  (tba further)    Recommendations for Other Services       Precautions / Restrictions Precautions Precautions: Other (comment) Precaution Comments: monitor O2, pt denies h/o falls in past year Restrictions Weight Bearing Restrictions: No      Mobility Bed Mobility Overal bed mobility: Needs Assistance Bed Mobility: Supine to Sit     Supine to sit: Min assist     General bed mobility comments: min A to raise trunk; pt sat at edge of bed x ~10 minutes, with supine to sit SaO2 was 87% on 4L, 93% on 5L sitting at edge of bed, VCs for pursed lip breathing; HR 112 max while sitting, 2/4 dyspnea with frequent coughing while sitting  Transfers Overall transfer level: Needs assistance Equipment used: 2 person hand held assist Transfers: Sit to/from [facility] Sit to Stand: Min guard Stand pivot transfers: Min guard;Min assist       General transfer comment: min/guard to min A for SPT    Balance                                            ADL either performed or assessed with clinical judgement   ADL Overall ADL's : Needs assistance/impaired Eating/Feeding: Independent   Grooming: Set up   Upper Body Bathing: Minimal assistance   Lower Body Bathing: Moderate assistance   Upper Body Dressing : Minimal assistance   Lower Body Dressing: Moderate assistance   Toilet Transfer: Minimal assistance;Stand-pivot(to chair)   Toileting- Clothing Manipulation and Hygiene: Minimal assistance         General ADL Comments: based on clinical judgment. Pt coughing with activity/talking.   At baseline, she bends forward for LB adls     Vision         Perception     Praxis      Pertinent Vitals/Pain Pain Assessment: No/denies pain     Hand Dominance     Extremity/Trunk Assessment Upper Extremity Assessment Upper Extremity Assessment: Defer to OT evaluation   Lower Extremity Assessment Lower Extremity Assessment: Overall WFL for tasks assessed   Cervical / Trunk Assessment Cervical / Trunk Assessment: Normal   Communication Communication Communication: No difficulties   Cognition Arousal/Alertness: Awake/alert Behavior During Therapy: WFL for tasks assessed/performed Overall Cognitive Status: No family/caregiver present to determine baseline cognitive functioning  General Comments: increased processing time, pt did not respond to some questions   General Comments  initially on 4 liters, sats down to 87% with movement; bumped to 5 for transfer, and sats were 93%.  HR up to 112    Exercises     Shoulder Instructions      Home Living Family/patient expects to be discharged to:: Private residence Living Arrangements: Alone Available Help at Discharge: Friend(s);Available 24 hours/day Type of Home: House       Home Layout: One level                   Additional Comments: lives alone, but can stay at her  boyfriend's home, he is available 83*      Prior Functioning/Environment Level of Independence: Independent        Comments: mowed her own lawn        OT Problem List: Cardiopulmonary status limiting activity;Decreased activity tolerance;Impaired balance (sitting and/or standing);Decreased knowledge of use of DME or AE      OT Treatment/Interventions: Self-care/ADL training;Energy conservation;DME and/or AE instruction;Therapeutic activities;Patient/family education;Balance training    OT Goals(Current goals can be found in the care plan section) Acute Rehab OT Goals Patient Stated Goal: return to independence OT Goal Formulation: With patient Time For Goal Achievement: 05/11/19 Potential to Achieve Goals: Good ADL Goals Pt Will Perform Grooming: with supervision;standing Pt Will Transfer to Toilet: with supervision;ambulating;regular height toilet;bedside commode(vs) Additional ADL Goal #1: pt will complete adl with set up and initiate at least one rest break for energy conservation  OT Frequency: Min 2X/week   Barriers to D/C:            Co-evaluation PT/OT/SLP Co-Evaluation/Treatment: Yes Reason for Co-Treatment: For patient/therapist safety PT goals addressed during session: Mobility/safety with mobility OT goals addressed during session: ADL's and self-care      AM-PAC OT "6 Clicks" Daily Activity     Outcome Measure Help from another person eating meals?: None Help from another person taking care of personal grooming?: A Little Help from another person toileting, which includes using toliet, bedpan, or urinal?: A Little Help from another person bathing (including washing, rinsing, drying)?: A Lot Help from another person to put on and taking off regular upper body clothing?: A Little Help from another person to put on and taking off regular lower body clothing?: A Lot 6 Click Score: 17   End of Session    Activity Tolerance: Patient limited by  fatigue Patient left: in chair;with call bell/phone within reach;with chair alarm set  OT Visit Diagnosis: Unsteadiness on feet (R26.81)                Time: 1100-1126 OT Time Calculation (min): 26 min Charges:  OT General Charges $OT Visit: 1 Visit OT Evaluation $OT Eval Low Complexity: 1 Low  Angella Montas S, OTR/L Acute Rehabilitation Services 04/27/2019  [place] 04/27/2019, 12:01 PM

## 2019-04-27 NOTE — Progress Notes (Signed)
PROGRESS NOTE  Lauren Peters [id number] DOB: [birth year]   PCP: Kathyrn Lass, MD  Patient is from: Home.  Independent for ADLs and IADLs at baseline.  DOA: 04/23/2019 LOS: 4  Brief Narrative / Interim history: 64 year old female with history of anxiety, depression and GERD presented to [facility] ED with worsening shortness of breath, fever, chills, cough and myalgia for 6 days.  She tested positive for COVID-19 on 04/20/2019.  In ED, febrile to 100.4.  Slightly tachycardic.  RR 18-33.  Slightly hypertensive.  Desaturated to 86% on RA requiring 2 L.  CBC and CMP normal.  CRP 8.3.  Pro-Cal and LA within normal range.  CXR with scattered bilateral airspace opacities.  Admitted for acute respiratory failure with hypoxia due to COVID-19 infection.  Started on remdesivir and Decadron.   Patient was continued on remdesivir.  She received Actemra on 04/25/2019.  She was a switch from Decadron to Solu-Medrol.  However, she continued to require about 4 L oxygen to maintain appropriate saturation despite improvement in her inflammatory markers  Subjective: No major events overnight of this morning.  She states she feels better other than cough, DOE and her oxygen level.  She denies chest pain, GI or UTI symptoms.  Objective: Vitals:   04/27/19 0645 04/27/19 0747 04/27/19 0816 04/27/19 1127  BP:      Pulse:      Resp:      Temp:      TempSrc:      SpO2: 90% 92% 95% 95%  Weight:      Height:        Intake/Output Summary (Last 24 hours) at 04/27/2019 1158 Last data filed at 04/27/2019 1023 Gross per 24 hour  Intake 520 ml  Output 950 ml  Net -430 ml   Filed Weights   04/23/19 2159  Weight: 70.3 kg    Examination:  GENERAL: No acute distress.  Appears well.  HEENT: MMM.  Vision and hearing grossly intact.  NECK: Supple.  No apparent JVD.  RESP: Desaturated to 87% on RA.  No IWOB.  Diminished aeration bilaterally. CVS:  RRR. Heart sounds normal.  ABD/GI/GU: Bowel sounds present. Soft. Non  tender.  MSK/EXT:  Moves extremities. No apparent deformity. No edema.  SKIN: no apparent skin lesion or wound NEURO: Awake, alert and oriented appropriately.  No apparent focal neuro deficit. PSYCH: Calm. Normal affect.  Procedures:  None  Assessment & Plan: Acute respiratory failure with hypoxia due to COVID-19 pneumonia: symptomatic for 6 days prior to admission.  Tested positive on 1/6.  Desaturated to 86% on RA.  CXR with bilateral opacities.  CRP 8.3 on admission.  Pro-Cal and lactic acid negative.  Blood cultures negative.  Inflammatory markers improved but continues to require supplemental oxygen.  She desaturated to 87% on RA at rest. Recent Labs    04/25/19 0408 04/26/19 0422 04/27/19 0229  DDIMER 0.76* 0.75* 0.65*  FERRITIN 886* 837* 886*  CRP 4.6* 1.0* 0.6  -Actemra on 04/25/2019. -Continue remdesivir and Decadron 1/10-1/12.  Solu-Medrol 1/12>> -Subcu Lovenox for VTE prophylaxis. -Supportive care with inhalers, mucolytic/antitussive, vitamins and incentive spirometry. -OOB and proning as able. -Wean oxygen as able. -Monitor inflammatory markers.  Elevated liver enzymes: Likely due to COVID-19 and remdesivir. -Continue monitoring.  Anxiety/depression: Stable -Continue Wellbutrin-might not be a great choice for anxiety but is stable.  GERD: -PPI   Generalized weakness -PT/OT eval.  Pressure injury of right and left buttocks: stage I. POA Pressure Injury 04/24/19 Buttocks Right;Left Stage 1 -  Intact skin with non-blanchable redness of a localized area usually over a bony prominence. Reddness (Active)  04/24/19 1025  Location: Buttocks  Location Orientation: Right;Left  Staging: Stage 1 -  Intact skin with non-blanchable redness of a localized area usually over a bony prominence.  Wound Description (Comments): Reddness  Present on Admission: Yes              DVT prophylaxis: Subcu Lovenox Code Status: Full code-confirmed. Family Communication: Updated  patient's daughter at patient's request. Disposition Plan: Remains inpatient due to respiratory distress and oxygen requirement Consultants: None   Microbiology summarized: 1/6-COVID-19 positive. Blood cultures negative.  Sch Meds:  Scheduled Meds: . vitamin C  500 mg Oral Daily  . benzonatate  100 mg Oral TID  . buPROPion  75 mg Oral BID  . docusate sodium  100 mg Oral BID  . enoxaparin (LOVENOX) injection  40 mg Subcutaneous Q24H  . ipratropium  2 puff Inhalation TID  . methylPREDNISolone (SOLU-MEDROL) injection  40 mg Intravenous Q12H  . pantoprazole  40 mg Oral Daily  . senna  1 tablet Oral BID  . sodium chloride flush  3 mL Intravenous Q12H  . zinc sulfate  220 mg Oral Daily   Continuous Infusions: . remdesivir 100 mg in NS 100 mL 100 mg (04/27/19 1023)   PRN Meds:.acetaminophen, chlorpheniramine-HYDROcodone, guaiFENesin-dextromethorphan, HYDROcodone-acetaminophen, ondansetron (ZOFRAN) IV  Antimicrobials: Anti-infectives (From admission, onward)   Start     Dose/Rate Route Frequency Ordered Stop   04/25/19 1000  remdesivir 100 mg in sodium chloride 0.9 % 100 mL IVPB     100 mg 200 mL/hr over 30 Minutes Intravenous Daily 04/24/19 1100 04/29/19 0959   04/24/19 1200  remdesivir 200 mg in sodium chloride 0.9% 250 mL IVPB     200 mg 580 mL/hr over 30 Minutes Intravenous Once 04/24/19 1100 04/24/19 1900       I have personally reviewed the following labs and images: CBC: Recent Labs  Lab 04/23/19 2219 04/24/19 1155 04/25/19 0408 04/26/19 0422 04/27/19 0229  WBC 4.1 3.3* 3.5* 4.5 5.0  NEUTROABS 3.2  --  2.7 3.3 3.8  HGB 13.8 14.2 14.0 14.1 14.0  HCT 40.7 42.4 41.4 43.6 42.6  MCV 89.3 88.7 88.3 92.0 90.1  PLT 135* 158 165 220 259   BMP &GFR Recent Labs  Lab 04/23/19 2219 04/24/19 1155 04/25/19 0408 04/26/19 0422 04/27/19 0229  NA 137  --  142 139 140  K 3.7  --  4.4 4.3 4.3  CL 101  --  105 105 103  CO2 24  --  23 25 27   GLUCOSE 108*  --  128* 126*  157*  BUN 13  --  15 19 22   CREATININE 0.71 0.68 0.71 0.56 0.62  CALCIUM 8.1*  --  7.9* 8.4* 8.2*   Estimated Creatinine Clearance: 63 mL/min (by C-G formula based on SCr of 0.62 mg/dL). Liver & Pancreas: Recent Labs  Lab 04/23/19 2219 04/25/19 0408 04/26/19 0422 04/27/19 0229  AST 38 50* 41 54*  ALT 36 43 43 79*  ALKPHOS 77 68 66 66  BILITOT 0.9 1.0 0.8 0.8  PROT 6.6 6.2* 6.1* 5.8*  ALBUMIN 3.6 3.0* 3.1* 3.0*   No results for input(s): LIPASE, AMYLASE in the last 168 hours. No results for input(s): AMMONIA in the last 168 hours. Diabetic: No results for input(s): HGBA1C in the last 72 hours. Recent Labs  Lab 04/24/19 1128 04/24/19 1618 04/26/19 0802  GLUCAP 107* 140* 125*  Cardiac Enzymes: No results for input(s): CKTOTAL, CKMB, CKMBINDEX, TROPONINI in the last 168 hours. No results for input(s): PROBNP in the last 8760 hours. Coagulation Profile: No results for input(s): INR, PROTIME in the last 168 hours. Thyroid Function Tests: No results for input(s): TSH, T4TOTAL, FREET4, T3FREE, THYROIDAB in the last 72 hours. Lipid Profile: No results for input(s): CHOL, HDL, LDLCALC, TRIG, CHOLHDL, LDLDIRECT in the last 72 hours. Anemia Panel: Recent Labs    04/26/19 0422 04/27/19 0229  FERRITIN 837* 886*   Urine analysis:    Component Value Date/Time   COLORURINE YELLOW 08/19/2018 0917   APPEARANCEUR HAZY (A) 08/19/2018 0917   LABSPEC 1.018 08/19/2018 0917   PHURINE 5.0 08/19/2018 0917   GLUCOSEU NEGATIVE 08/19/2018 0917   HGBUR LARGE (A) 08/19/2018 0917   BILIRUBINUR NEGATIVE 08/19/2018 [place] 08/19/2018 0917   PROTEINUR NEGATIVE 08/19/2018 0917   NITRITE NEGATIVE 08/19/2018 0917   LEUKOCYTESUR MODERATE (A) 08/19/2018 0917   Sepsis Labs: Invalid input(s): PROCALCITONIN, [place]  Microbiology: Recent Results (from the past 240 hour(s))  Novel Coronavirus, NAA ([facility])     Status: Abnormal   Collection Time: 04/20/19  9:55 AM    Specimen: Nasopharyngeal(NP) swabs in vial transport medium   NASOPHARYNGE  TESTING  Result Value Ref Range Status   SARS-CoV-2, NAA Detected (A) Not Detected Final    Comment: This nucleic acid amplification test was developed and its performance characteristics determined by [employer]. Nucleic acid amplification tests include PCR and TMA. This test has not been FDA cleared or approved. This test has been authorized by FDA under an Emergency Use Authorization (EUA). This test is only authorized for the duration of time the declaration that circumstances exist justifying the authorization of the emergency use of in vitro diagnostic tests for detection of SARS-CoV-2 virus and/or diagnosis of COVID-19 infection under section 564(b)(1) of the Act, 21 U.S.C. [id number]) (1), unless the authorization is terminated or revoked sooner. When diagnostic testing is negative, the possibility of a false negative result should be considered in the context of a patient's recent exposures and the presence of clinical signs and symptoms consistent with COVID-19. An individual without symptoms of COVID-19 and who is not shedding SARS-CoV-2 virus would  expect to have a negative (not detected) result in this assay.   Blood Culture (routine x 2)     Status: None (Preliminary result)   Collection Time: 04/23/19 10:35 PM   Specimen: BLOOD LEFT ARM  Result Value Ref Range Status   Specimen Description   Final    BLOOD LEFT ARM Performed at [facility], [address] [place], [place] [address]    Special Requests   Final    BOTTLES DRAWN AEROBIC AND ANAEROBIC Blood Culture adequate volume Performed at [facility], [address] [place], [place] [address]    Culture   Final    NO GROWTH 3 DAYS Performed at [facility], [place] [address] [place], [place] [address]    Report Status PENDING  Incomplete  Blood Culture (routine x 2)     Status: None (Preliminary  result)   Collection Time: 04/23/19 10:45 PM   Specimen: BLOOD RIGHT FOREARM  Result Value Ref Range Status   Specimen Description   Final    BLOOD RIGHT FOREARM Performed at [facility], [address] [place], [place] [address]    Special Requests   Final    BOTTLES DRAWN  AEROBIC AND ANAEROBIC Blood Culture adequate volume Performed at [facility], [address] [place], [place] [address]    Culture   Final    NO GROWTH 3 DAYS Performed at [facility], [place] [address], [place], [place] [address]    Report Status PENDING  Incomplete    Radiology Studies: No results found.   Lauren Peters T. [place]  If 7PM-7AM, please contact night-coverage [email or web address] Password [email or web address] 04/27/2019, 11:58 AM

## 2019-04-27 NOTE — Progress Notes (Signed)
Patient was briefly seen and examined this afternoon.  She was transferred to [facility] from [facility].  She was evaluated earlier by Dr. Cyndia Skeeters.  Subjective: No complaints  Objective: Blood pressure (!) 138/93, pulse 76, temperature 97.7 F (36.5 C), temperature source Axillary, resp. rate 20, height 5' (1.524 m), weight 70.3 kg, SpO2 98 %.   Assessment and plan: Acute hypoxic respiratory failure secondary COVID-19 pneumonia: Continue steroids and remdesivir  See plans outlined by Dr. Cyndia Skeeters.  Will follow tomorrow.  No charge note.

## 2019-04-28 ENCOUNTER — Ambulatory Visit (HOSPITAL_COMMUNITY): Admit: 2019-04-28 | Payer: BC Managed Care – PPO

## 2019-04-28 LAB — CBC WITH DIFFERENTIAL/PLATELET
Abs Immature Granulocytes: 0.26 10*3/uL — ABNORMAL HIGH (ref 0.00–0.07)
Basophils Absolute: 0.1 10*3/uL (ref 0.0–0.1)
Basophils Relative: 1 %
Eosinophils Absolute: 0 10*3/uL (ref 0.0–0.5)
Eosinophils Relative: 0 %
HCT: 42.6 % (ref 36.0–46.0)
Hemoglobin: 14.4 g/dL (ref 12.0–15.0)
Immature Granulocytes: 4 %
Lymphocytes Relative: 16 %
Lymphs Abs: 1 10*3/uL (ref 0.7–4.0)
MCH: 29.8 pg (ref 26.0–34.0)
MCHC: 33.8 g/dL (ref 30.0–36.0)
MCV: 88 fL (ref 80.0–100.0)
Monocytes Absolute: 0.6 10*3/uL (ref 0.1–1.0)
Monocytes Relative: 9 %
Neutro Abs: 4.5 10*3/uL (ref 1.7–7.7)
Neutrophils Relative %: 70 %
Platelets: 268 10*3/uL (ref 150–400)
RBC: 4.84 MIL/uL (ref 3.87–5.11)
RDW: 12.8 % (ref 11.5–15.5)
WBC: 6.5 10*3/uL (ref 4.0–10.5)
nRBC: 0 % (ref 0.0–0.2)

## 2019-04-28 LAB — COMPREHENSIVE METABOLIC PANEL
ALT: 66 U/L — ABNORMAL HIGH (ref 0–44)
AST: 29 U/L (ref 15–41)
Albumin: 3.4 g/dL — ABNORMAL LOW (ref 3.5–5.0)
Alkaline Phosphatase: 65 U/L (ref 38–126)
Anion gap: 8 (ref 5–15)
BUN: 23 mg/dL (ref 8–23)
CO2: 30 mmol/L (ref 22–32)
Calcium: 8.3 mg/dL — ABNORMAL LOW (ref 8.9–10.3)
Chloride: 102 mmol/L (ref 98–111)
Creatinine, Ser: 0.62 mg/dL (ref 0.44–1.00)
GFR calc Af Amer: >60 mL/min (ref >60)
GFR calc non Af Amer: >60 mL/min (ref >60)
Glucose, Bld: 140 mg/dL — ABNORMAL HIGH (ref 70–99)
Potassium: 4.2 mmol/L (ref 3.5–5.1)
Sodium: 140 mmol/L (ref 135–145)
Total Bilirubin: 0.9 mg/dL (ref 0.3–1.2)
Total Protein: 6.4 g/dL — ABNORMAL LOW (ref 6.5–8.1)

## 2019-04-28 LAB — D-DIMER, QUANTITATIVE: D-Dimer, Quant: 0.79 ug/mL-FEU — ABNORMAL HIGH (ref 0.00–0.50)

## 2019-04-28 LAB — C-REACTIVE PROTEIN: CRP: <0.5 mg/dL (ref <1.0)

## 2019-04-28 LAB — FERRITIN: Ferritin: 686 ng/mL — ABNORMAL HIGH (ref 11–307)

## 2019-04-28 MED ORDER — ENSURE ENLIVE PO LIQD: 237.0000 mL | Freq: Two times a day (BID) | ORAL | Status: DC

## 2019-04-28 MED ORDER — ENSURE ENLIVE PO LIQD
237.0000 mL | Freq: Two times a day (BID) | ORAL | Status: DC
  Administered 2019-04-29 – 2019-05-01 (×6): 237 mL via ORAL

## 2019-04-28 MED ORDER — AMLODIPINE BESYLATE 5 MG PO TABS
5.0000 mg | ORAL_TABLET | Freq: Every day | ORAL | Status: DC
  Administered 2019-04-28 – 2019-05-01 (×4): 5 mg via ORAL
  Filled 2019-04-28 (×4): qty 1

## 2019-04-28 MED ORDER — PHENOL 1.4 % MT LIQD
1.0000 | OROMUCOSAL | Status: DC | PRN
  Filled 2019-04-28: qty 177

## 2019-04-28 MED ORDER — BOOST / RESOURCE BREEZE PO LIQD CUSTOM
1.0000 | Freq: Three times a day (TID) | ORAL | Status: DC
  Administered 2019-04-28 – 2019-04-29 (×5): 1 via ORAL
  Administered 2019-04-30: 250 mL via ORAL
  Administered 2019-04-30 – 2019-05-01 (×3): 1 via ORAL
  Filled 2019-04-28 (×11): qty 1

## 2019-04-28 NOTE — Progress Notes (Addendum)
PROGRESS NOTE  Lauren Peters [id number] DOB: 09-04-1955   PCP: Kathyrn Lass, MD  Patient is from: Home.  Independent for ADLs and IADLs at baseline.  DOA: 04/23/2019 LOS: 5  Brief Narrative / Interim history: 64 year old female with history of anxiety, depression and GERD presented to [facility] ED with worsening shortness of breath, fever, chills, cough and myalgia for 6 days.  She tested positive for COVID-19 on 04/20/2019.  In ED, febrile to 100.4.  Slightly tachycardic.  RR 18-33.  Slightly hypertensive.  Desaturated to 86% on RA requiring 2 L.  CBC and CMP normal.  CRP 8.3.  Pro-Cal and LA within normal range.  CXR with scattered bilateral airspace opacities.  Admitted for acute respiratory failure with hypoxia due to COVID-19 infection.  Started on remdesivir and Decadron.   Patient was continued on remdesivir.  She received Actemra on 04/25/2019.  She was a switch from Decadron to Solu-Medrol.  However, she continued to require about 4 L oxygen to maintain appropriate saturation despite improvement in her inflammatory markers  Subjective: Patient reports significant cough, poor night sleep, she has very poor appetite, only had 10% of her breakfast this morning and dinner yesterday.  Objective: Vitals:   04/28/19 0500 04/28/19 0600 04/28/19 0800 04/28/19 1222  BP:   (!) 154/95 135/84  Pulse: 75 62 83 88  Resp: 19 (!) 25 20 (!) 24  Temp:   98.6 F (37 C) 97.8 F (36.6 C)  TempSrc:   Oral Oral  SpO2: 95% 97% 99% 92%  Weight:      Height:        Intake/Output Summary (Last 24 hours) at 04/28/2019 1321 Last data filed at 04/28/2019 0850 Gross per 24 hour  Intake 700 ml  Output 650 ml  Net 50 ml   Filed Weights   04/23/19 2159  Weight: 70.3 kg      Assessment & Plan:  Acute respiratory failure with hypoxia due to COVID-19 pneumonia: -this morning she remains with oxygen requirement, she is on 3 to 4 L nasal cannula at rest, have discussed with staff they will try to ambulate  to see if she has any increased oxygen requirement with exertion . -Continue with IV remdesivir. -Continue with IV steroids -Received Actemra 04/25/2019 -Tinea to trend inflammatory markers closely, CRP has normalized which is reassuring. -I have discussed at length with the patient, encouraged her to use incentive spirometry, flutter valve, and to prone once possible, and to get out of bed to chair, patient with significant cough, have asked her to request her as needed cough medications more frequently.  Recent Labs    04/26/19 0422 04/27/19 0229 04/28/19 0249  DDIMER 0.75* 0.65* 0.79*  FERRITIN 837* 886* 686*  CRP 1.0* 0.6 <0.5   Elevated liver enzymes: -  Likely due to COVID-19 and remdesivir. -Continue monitoring.  Anxiety/depression: Stable -Continue Wellbutrin-might not be a great choice for anxiety but is stable.  htypertension - BP is elevated , will start on norvasc  GERD: -PPI   Generalized weakness -PT/OT eval.  Pressure injury of right and left buttocks: stage I. POA Pressure Injury 04/24/19 Buttocks Right;Left Stage 1 -  Intact skin with non-blanchable redness of a localized area usually over a bony prominence. Reddness (Active)  04/24/19 1025  Location: Buttocks  Location Orientation: Right;Left  Staging: Stage 1 -  Intact skin with non-blanchable redness of a localized area usually over a bony prominence.  Wound Description (Comments): Reddness  Present on Admission: Yes  Remains with very poor oral intake, I have started her on Ensure, encouraged her to increase her fluid intake and oral intake         DVT prophylaxis: Subcu Lovenox Code Status: Full code-confirmed. Family Communication:  Disposition Plan: Remains inpatient due to respiratory distress and oxygen requirement Consultants: None   Microbiology summarized: 1/6-COVID-19 positive. Blood cultures negative.  Sch Meds:  Scheduled Meds: . amLODipine  5 mg Oral Daily  . vitamin C   500 mg Oral Daily  . benzonatate  100 mg Oral TID  . buPROPion  75 mg Oral BID  . docusate sodium  100 mg Oral BID  . enoxaparin (LOVENOX) injection  40 mg Subcutaneous Q24H  . feeding supplement  1 Container Oral TID BM  . ipratropium  2 puff Inhalation TID  . methylPREDNISolone (SOLU-MEDROL) injection  40 mg Intravenous Q12H  . pantoprazole  40 mg Oral Daily  . senna  1 tablet Oral BID  . sodium chloride flush  3 mL Intravenous Q12H  . zinc sulfate  220 mg Oral Daily   Continuous Infusions:  PRN Meds:.acetaminophen, chlorpheniramine-HYDROcodone, guaiFENesin-dextromethorphan, HYDROcodone-acetaminophen, ondansetron (ZOFRAN) IV, phenol  Antimicrobials: Anti-infectives (From admission, onward)   Start     Dose/Rate Route Frequency Ordered Stop   04/27/19 1915  remdesivir 100 mg in sodium chloride 0.9 % 100 mL IVPB  Status:  Discontinued     100 mg 200 mL/hr over 30 Minutes Intravenous  Once 04/27/19 1913 04/27/19 1928   04/25/19 1000  remdesivir 100 mg in sodium chloride 0.9 % 100 mL IVPB     100 mg 200 mL/hr over 30 Minutes Intravenous Daily 04/24/19 1100 04/28/19 0920   04/24/19 1200  remdesivir 200 mg in sodium chloride 0.9% 250 mL IVPB     200 mg 580 mL/hr over 30 Minutes Intravenous Once 04/24/19 1100 04/24/19 1900     Examination:  Awake Alert, Oriented X 3, frail, in no apparent distress Symmetrical Chest wall movement, Good air movement bilaterally, CTAB RRR,No Gallops,Rubs or new Murmurs, No Parasternal Heave +ve B.Sounds, Abd Soft, No tenderness, No rebound - guarding or rigidity. No Cyanosis, Clubbing or edema, No new Rash or bruise     I have personally reviewed the following labs and images: CBC: Recent Labs  Lab 04/23/19 2219 04/23/19 2219 04/24/19 1155 04/25/19 0408 04/26/19 0422 04/27/19 0229 04/28/19 0249  WBC 4.1   < > 3.3* 3.5* 4.5 5.0 6.5  NEUTROABS 3.2  --   --  2.7 3.3 3.8 4.5  HGB 13.8   < > 14.2 14.0 14.1 14.0 14.4  HCT 40.7   < > 42.4 41.4  43.6 42.6 42.6  MCV 89.3   < > 88.7 88.3 92.0 90.1 88.0  PLT 135*   < > 158 165 220 259 268   < > = values in this interval not displayed.   BMP &GFR Recent Labs  Lab 04/23/19 2219 04/23/19 2219 04/24/19 1155 04/25/19 0408 04/26/19 0422 04/27/19 0229 04/28/19 0249  NA 137  --   --  142 139 140 140  K 3.7  --   --  4.4 4.3 4.3 4.2  CL 101  --   --  105 105 103 102  CO2 24  --   --  23 25 27 30   GLUCOSE 108*  --   --  128* 126* 157* 140*  BUN 13  --   --  15 19 22 23   CREATININE 0.71   < > 0.68 0.71  0.56 0.62 0.62  CALCIUM 8.1*  --   --  7.9* 8.4* 8.2* 8.3*   < > = values in this interval not displayed.   Estimated Creatinine Clearance: 63 mL/min (by C-G formula based on SCr of 0.62 mg/dL). Liver & Pancreas: Recent Labs  Lab 04/23/19 2219 04/25/19 0408 04/26/19 0422 04/27/19 0229 04/28/19 0249  AST 38 50* 41 54* 29  ALT 36 43 43 79* 66*  ALKPHOS 77 68 66 66 65  BILITOT 0.9 1.0 0.8 0.8 0.9  PROT 6.6 6.2* 6.1* 5.8* 6.4*  ALBUMIN 3.6 3.0* 3.1* 3.0* 3.4*   No results for input(s): LIPASE, AMYLASE in the last 168 hours. No results for input(s): AMMONIA in the last 168 hours. Diabetic: No results for input(s): HGBA1C in the last 72 hours. Recent Labs  Lab 04/24/19 1128 04/24/19 1618 04/26/19 0802 04/27/19 2014  GLUCAP 107* 140* 125* 160*   Cardiac Enzymes: No results for input(s): CKTOTAL, CKMB, CKMBINDEX, TROPONINI in the last 168 hours. No results for input(s): PROBNP in the last 8760 hours. Coagulation Profile: No results for input(s): INR, PROTIME in the last 168 hours. Thyroid Function Tests: No results for input(s): TSH, T4TOTAL, FREET4, T3FREE, THYROIDAB in the last 72 hours. Lipid Profile: No results for input(s): CHOL, HDL, LDLCALC, TRIG, CHOLHDL, LDLDIRECT in the last 72 hours. Anemia Panel: Recent Labs    04/27/19 0229 04/28/19 0249  FERRITIN 886* 686*   Urine analysis:    Component Value Date/Time   COLORURINE YELLOW 08/19/2018 0917    APPEARANCEUR HAZY (A) 08/19/2018 0917   LABSPEC 1.018 08/19/2018 0917   PHURINE 5.0 08/19/2018 0917   GLUCOSEU NEGATIVE 08/19/2018 0917   HGBUR LARGE (A) 08/19/2018 0917   BILIRUBINUR NEGATIVE 08/19/2018 [place] 08/19/2018 0917   PROTEINUR NEGATIVE 08/19/2018 0917   NITRITE NEGATIVE 08/19/2018 0917   LEUKOCYTESUR MODERATE (A) 08/19/2018 0917   Sepsis Labs: Invalid input(s): PROCALCITONIN, [place]  Microbiology: Recent Results (from the past 240 hour(s))  Novel Coronavirus, NAA ([facility])     Status: Abnormal   Collection Time: 04/20/19  9:55 AM   Specimen: Nasopharyngeal(NP) swabs in vial transport medium   NASOPHARYNGE  TESTING  Result Value Ref Range Status   SARS-CoV-2, NAA Detected (A) Not Detected Final    Comment: This nucleic acid amplification test was developed and its performance characteristics determined by [employer]. Nucleic acid amplification tests include PCR and TMA. This test has not been FDA cleared or approved. This test has been authorized by FDA under an Emergency Use Authorization (EUA). This test is only authorized for the duration of time the declaration that circumstances exist justifying the authorization of the emergency use of in vitro diagnostic tests for detection of SARS-CoV-2 virus and/or diagnosis of COVID-19 infection under section 564(b)(1) of the Act, 21 U.S.C. [id number]) (1), unless the authorization is terminated or revoked sooner. When diagnostic testing is negative, the possibility of a false negative result should be considered in the context of a patient's recent exposures and the presence of clinical signs and symptoms consistent with COVID-19. An individual without symptoms of COVID-19 and who is not shedding SARS-CoV-2 virus would  expect to have a negative (not detected) result in this assay.   Blood Culture (routine x 2)     Status: None (Preliminary result)   Collection Time: 04/23/19 10:35 PM    Specimen: BLOOD LEFT ARM  Result Value Ref Range Status   Specimen Description   Final    BLOOD LEFT ARM  Performed at [facility], [address] [place], [place] [address]    Special Requests   Final    BOTTLES DRAWN AEROBIC AND ANAEROBIC Blood Culture adequate volume Performed at [facility], [address] [place], [place] [address]    Culture   Final    NO GROWTH 4 DAYS Performed at [facility], [place] [address] [place], [place] [address]    Report Status PENDING  Incomplete  Blood Culture (routine x 2)     Status: None (Preliminary result)   Collection Time: 04/23/19 10:45 PM   Specimen: BLOOD RIGHT FOREARM  Result Value Ref Range Status   Specimen Description   Final    BLOOD RIGHT FOREARM Performed at [facility], [address] [place], [place] [address]    Special Requests   Final    BOTTLES DRAWN AEROBIC AND ANAEROBIC Blood Culture adequate volume Performed at [facility], [address] [place], [place] [address]    Culture   Final    NO GROWTH 4 DAYS Performed at [facility], [place] [address], [place], [place] [address]    Report Status PENDING  Incomplete    Radiology Studies: No results found.   Phillips Climes MD [facility]  If 7PM-7AM, please contact night-coverage [email or web address] Password [email or web address] 04/28/2019, 1:21 PM

## 2019-04-28 NOTE — Plan of Care (Signed)

## 2019-04-28 NOTE — Progress Notes (Addendum)
Updated pt's husband, Sonia Side, on [place] and pt progress. Husband had no concerns other than treatments being used for persistent cough.

## 2019-04-28 NOTE — Progress Notes (Signed)
Pt coughing worsening, still harse and non productive, afebrile, no change in lung sounds asculatated.  Robitussin given, pt has chloraseptic at bedside for prn use. Pts throat, chest, ribs sore from coughing, medicated with pain med, pt has not slept this pm either. Will monitor effectiveness of medication and hope to offer some releif for sleep and rest.

## 2019-04-28 NOTE — Progress Notes (Signed)
Pt with increasing O2 needs this shift. Pt now requiring 5L O2 to keep sats greater than > 90% while at rest. Pt continues to have a strong, persistent cough. PRN cough meds given with little relief. Vitals stable. No distress noted. Will continue plan of care.

## 2019-04-28 NOTE — Progress Notes (Signed)
Pt currently comfortable in bed, coughing has subsided, meds given to cough, ie: cough meds, chloraseptic, and pain med for chest/rib discomfort d/t force of cough. Pt noted positive effects with above, able to decrease O2 fo 4L [place]. Pts afebrile, BP continues slightly higher than pts normal, considering the coughing and anxiety w/disease process. Pt denies any s/sx of HTN. Heart rate wnl, no palpitations or cardiac chest pain. Pt encouraged to use IS and Flutter valve, continues to need encouragement. Appetite poor, although fluid intake sufficient throughout pm shift, pure wick (positional), but functioning. Pt afebrile. Has not moved bowels this shift, but does take bowel regime meds at hs. Will monitor and report effectiveness, report related to this will be passed to next shift. Pt comfortable and unremarkable at this time, report will be given at bed/window side with view of pts status. This RN to report and changes along with pt concerns. Note that pt does much better with cough releif once taken cough med along w/pain med to help relieve the chest soreness/discomfort from her forceful, dry cough. Teaching done w/pt regarding need for IS, flutter valve and strengthening exercises for lungs and pulmonary toileting. Pt aware and verbalized understanding .

## 2019-04-28 NOTE — Plan of Care (Signed)

## 2019-04-29 LAB — CBC WITH DIFFERENTIAL/PLATELET
Abs Immature Granulocytes: 0.24 10*3/uL — ABNORMAL HIGH (ref 0.00–0.07)
Basophils Absolute: 0 10*3/uL (ref 0.0–0.1)
Basophils Relative: 0 %
Eosinophils Absolute: 0 10*3/uL (ref 0.0–0.5)
Eosinophils Relative: 0 %
HCT: 43 % (ref 36.0–46.0)
Hemoglobin: 14.4 g/dL (ref 12.0–15.0)
Immature Granulocytes: 3 %
Lymphocytes Relative: 9 %
Lymphs Abs: 0.7 10*3/uL (ref 0.7–4.0)
MCH: 29.4 pg (ref 26.0–34.0)
MCHC: 33.5 g/dL (ref 30.0–36.0)
MCV: 87.8 fL (ref 80.0–100.0)
Monocytes Absolute: 0.3 10*3/uL (ref 0.1–1.0)
Monocytes Relative: 4 %
Neutro Abs: 6.4 10*3/uL (ref 1.7–7.7)
Neutrophils Relative %: 84 %
Platelets: 299 10*3/uL (ref 150–400)
RBC: 4.9 MIL/uL (ref 3.87–5.11)
RDW: 12.7 % (ref 11.5–15.5)
WBC: 7.7 10*3/uL (ref 4.0–10.5)
nRBC: 0 % (ref 0.0–0.2)

## 2019-04-29 LAB — COMPREHENSIVE METABOLIC PANEL
ALT: 50 U/L — ABNORMAL HIGH (ref 0–44)
AST: 18 U/L (ref 15–41)
Albumin: 3.5 g/dL (ref 3.5–5.0)
Alkaline Phosphatase: 66 U/L (ref 38–126)
Anion gap: 10 (ref 5–15)
BUN: 22 mg/dL (ref 8–23)
CO2: 29 mmol/L (ref 22–32)
Calcium: 8.5 mg/dL — ABNORMAL LOW (ref 8.9–10.3)
Chloride: 100 mmol/L (ref 98–111)
Creatinine, Ser: 0.62 mg/dL (ref 0.44–1.00)
GFR calc Af Amer: >60 mL/min (ref >60)
GFR calc non Af Amer: >60 mL/min (ref >60)
Glucose, Bld: 179 mg/dL — ABNORMAL HIGH (ref 70–99)
Potassium: 4.1 mmol/L (ref 3.5–5.1)
Sodium: 139 mmol/L (ref 135–145)
Total Bilirubin: 1 mg/dL (ref 0.3–1.2)
Total Protein: 6.1 g/dL — ABNORMAL LOW (ref 6.5–8.1)

## 2019-04-29 LAB — FERRITIN: Ferritin: 553 ng/mL — ABNORMAL HIGH (ref 11–307)

## 2019-04-29 LAB — D-DIMER, QUANTITATIVE: D-Dimer, Quant: 0.71 ug/mL-FEU — ABNORMAL HIGH (ref 0.00–0.50)

## 2019-04-29 LAB — C-REACTIVE PROTEIN: CRP: 0.6 mg/dL (ref <1.0)

## 2019-04-29 LAB — PHOSPHORUS: Phosphorus: 4 mg/dL (ref 2.5–4.6)

## 2019-04-29 MED ORDER — GUAIFENESIN ER 600 MG PO TB12
1200.0000 mg | ORAL_TABLET | Freq: Two times a day (BID) | ORAL | Status: DC
  Administered 2019-04-29 – 2019-05-01 (×5): 1200 mg via ORAL
  Filled 2019-04-29 (×4): qty 2

## 2019-04-29 NOTE — Progress Notes (Signed)
Occupational Therapy Treatment Patient Details Name: Lauren Peters MRN: [id number] DOB: 1955-09-14 Today's Date: 04/29/2019    History of present illness 64 year old female with history of anxiety, depression and GERD presented to [facility] ED with worsening shortness of breath, fever, chills, cough and myalgia for 6 days.  She tested positive for COVID-19 on 04/20/2019.   OT comments  Patient up in chair on arrival.  She stated she is feeling a little better than yesterday.  Completed functional mobility and transfers with min guard and rolling walker.  Stood at sink to perform teeth brushing and face washing with set up.  After 5 min she felt lightheaded and wanted to sit down.  May have been because room was particularly warm.  Did 5 sit to stands while performing LE dressing.  LE dressing required mod assist.  Patient was on 2L O2 throughout session. Was at SpO2 92 at rest, and 87 with activity.  Educated on pursed lip breathing and performed x5 incentive spirometer (pulled 500). Will continue to follow with OT acutely.   Follow Up Recommendations  Home health OT;Supervision/Assistance - 24 hour    Equipment Recommendations       Recommendations for Other Services      Precautions / Restrictions Restrictions Weight Bearing Restrictions: No       Mobility Bed Mobility                  Transfers Overall transfer level: Needs assistance Equipment used: Rolling walker (2 wheeled) Transfers: Sit to/from Stand Sit to Stand: Min guard              Balance Overall balance assessment: Needs assistance         Standing balance support: During functional activity;Bilateral upper extremity supported Standing balance-Leahy Scale: Fair                             ADL either performed or assessed with clinical judgement   ADL Overall ADL's : Needs assistance/impaired     Grooming: Set up;Standing;Wash/dry hands;Wash/dry face;Oral care               Lower  Body Dressing: Moderate assistance;Sit to/from stand               Functional mobility during ADLs: Passenger transport manager     Praxis      Cognition Arousal/Alertness: Awake/alert Behavior During Therapy: WFL for tasks assessed/performed Overall Cognitive Status: Within Functional Limits for tasks assessed                                          Exercises     Shoulder Instructions       General Comments      Pertinent Vitals/ Pain          Home Living                                          Prior Functioning/Environment              Frequency  Min 2X/week        Progress Toward Goals  OT Goals(current goals can now be found  in the care plan section)  Progress towards OT goals: Progressing toward goals  Acute Rehab OT Goals Patient Stated Goal: Walk around more OT Goal Formulation: With patient Time For Goal Achievement: 05/11/19 Potential to Achieve Goals: Good  Plan Discharge plan remains appropriate    Co-evaluation          OT goals addressed during session: ADL's and self-care;Proper use of Adaptive equipment and DME;Strengthening/ROM      AM-PAC OT "6 Clicks" Daily Activity     Outcome Measure   Help from another person eating meals?: None Help from another person taking care of personal grooming?: A Little Help from another person toileting, which includes using toliet, bedpan, or urinal?: A Little Help from another person bathing (including washing, rinsing, drying)?: A Lot Help from another person to put on and taking off regular upper body clothing?: A Little Help from another person to put on and taking off regular lower body clothing?: A Lot 6 Click Score: 17    End of Session Equipment Utilized During Treatment: Rolling walker;Oxygen  OT Visit Diagnosis: Unsteadiness on feet (R26.81);Muscle weakness (generalized) (M62.81)   Activity Tolerance  Patient limited by fatigue;Other (comment)(lightheadedness)   Patient Left in chair;with call bell/phone within reach   Nurse Communication Mobility status        Time: [id number] OT Time Calculation (min): 26 min  Charges: OT General Charges $OT Visit: 1 Visit OT Treatments $Self Care/Home Management : 23-37 mins  Lauren Peters, OTR/L    Lauren Peters 04/29/2019, 4:14 PM

## 2019-04-29 NOTE — TOC Progression Note (Signed)
Transition of Care [facility]) - Progression Note    Patient Details  Name: Lauren Peters MRN: [id number] Date of Birth: 04/06/1956  Transition [facility]) CM/SW Contact  Shade Flood, LCSW Phone Number: 04/29/2019, 4:11 PM  Clinical Narrative:     TOC aware of PT recommendation for [facility] PT. Due to pt's insurance and it's coverage of HH, it is not likely that [facility] will be able to find an agency to provide the care. Pt may benefit from the [facility] to Home program as an alternative.  TOC will follow and continue to assess and assist.  Expected Discharge Plan: Home/Self Care Barriers to Discharge: Continued Medical Work up  Expected Discharge Plan and Services Expected Discharge Plan: Home/Self Care         Expected Discharge Date: 04/26/19                                     Social Determinants of Health (SDOH) Interventions    Readmission Risk Interventions No flowsheet data found.

## 2019-04-29 NOTE — Progress Notes (Signed)
PROGRESS NOTE  Lauren Peters [id number] DOB: October 27, 1955   PCP: Kathyrn Lass, MD  Patient is from: Home.  Independent for ADLs and IADLs at baseline.  DOA: 04/23/2019 LOS: 6  Brief Narrative / Interim history:  64 year old female with history of anxiety, depression and GERD presented to [facility] ED with worsening shortness of breath, fever, chills, cough and myalgia for 6 days.  She tested positive for COVID-19 on 04/20/2019.  In ED, febrile to 100.4.  Slightly tachycardic.  RR 18-33.  Slightly hypertensive.  Desaturated to 86% on RA requiring 2 L.  CBC and CMP normal.  CRP 8.3.  Pro-Cal and LA within normal range.  CXR with scattered bilateral airspace opacities.  Admitted for acute respiratory failure with hypoxia due to COVID-19 infection.  Started on remdesivir and Decadron.   Patient was continued on remdesivir.  She received Actemra on 04/25/2019.  She was a switch from Decadron to Solu-Medrol.  However, she continued to require about 4 L oxygen to maintain appropriate saturation despite improvement in her inflammatory markers  Subjective:  Patient reports had a better night sleep, cough still present, but improved, reports appetite has improved as well, still reports some dyspnea .  Objective: Vitals:   04/28/19 1539 04/28/19 2040 04/29/19 0510 04/29/19 0822  BP: 127/77 (!) 152/89  (!) 149/94  Pulse: 81 91 98 90  Resp: (!) 25 20  (!) 21  Temp: 97.7 F (36.5 C) 98 F (36.7 C) 98.4 F (36.9 C) 98.5 F (36.9 C)  TempSrc: Oral Oral Oral Oral  SpO2: 96% 95%  98%  Weight:      Height:       No intake or output data in the 24 hours ending 04/29/19 1524 Filed Weights   04/23/19 2159  Weight: 70.3 kg      Assessment & Plan:  Acute respiratory failure with hypoxia due to COVID-19 pneumonia: -Mains on 3 to 4 L nasal cannula, she was able to ambulate on 3 L nasal cannula as well . -Continue with IV remdesivir. -Continue with IV steroids -Received Actemra 04/25/2019 -Continue to  trend inflammatory markers closely, CRP has normalized which is reassuring. -I have discussed at length with the patient, encouraged her to use incentive spirometry, flutter valve, and to prone once possible, and to get out of bed to chair, patient with significant cough, have asked her to request her as needed cough medications more frequently.  Recent Labs    04/27/19 0229 04/28/19 0249 04/29/19 0104  DDIMER 0.65* 0.79* 0.71*  FERRITIN 886* 686* 553*  CRP 0.6 <0.5 0.6   Elevated liver enzymes: -  Likely due to COVID-19 and remdesivir. -Continue monitoring.  Anxiety/depression: Stable -Continue Wellbutrin-might not be a great choice for anxiety but is stable.  htypertension -Blood pressure improved after starting on Norvasc  GERD: -PPI   Generalized weakness -PT/OT eval.  Pressure injury of right and left buttocks: stage I. POA Pressure Injury 04/24/19 Buttocks Right;Left Stage 1 -  Intact skin with non-blanchable redness of a localized area usually over a bony prominence. Reddness (Active)  04/24/19 1025  Location: Buttocks  Location Orientation: Right;Left  Staging: Stage 1 -  Intact skin with non-blanchable redness of a localized area usually over a bony prominence.  Wound Description (Comments): Reddness  Present on Admission: Yes             DVT prophylaxis: Subcu Lovenox Code Status: Full code-confirmed. Family Communication: Discussed with patient Disposition Plan: Remains inpatient due to respiratory distress and  oxygen requirement Consultants: None   Microbiology summarized: 1/6-COVID-19 positive. Blood cultures negative.  Sch Meds:  Scheduled Meds: . amLODipine  5 mg Oral Daily  . vitamin C  500 mg Oral Daily  . benzonatate  100 mg Oral TID  . buPROPion  75 mg Oral BID  . docusate sodium  100 mg Oral BID  . enoxaparin (LOVENOX) injection  40 mg Subcutaneous Q24H  . feeding supplement  1 Container Oral TID BM  . feeding supplement (ENSURE  ENLIVE)  237 mL Oral BID BM  . ipratropium  2 puff Inhalation TID  . methylPREDNISolone (SOLU-MEDROL) injection  40 mg Intravenous Q12H  . pantoprazole  40 mg Oral Daily  . senna  1 tablet Oral BID  . sodium chloride flush  3 mL Intravenous Q12H  . zinc sulfate  220 mg Oral Daily   Continuous Infusions:  PRN Meds:.acetaminophen, chlorpheniramine-HYDROcodone, guaiFENesin-dextromethorphan, HYDROcodone-acetaminophen, ondansetron (ZOFRAN) IV, phenol  Antimicrobials: Anti-infectives (From admission, onward)   Start     Dose/Rate Route Frequency Ordered Stop   04/27/19 1915  remdesivir 100 mg in sodium chloride 0.9 % 100 mL IVPB  Status:  Discontinued     100 mg 200 mL/hr over 30 Minutes Intravenous  Once 04/27/19 1913 04/27/19 1928   04/25/19 1000  remdesivir 100 mg in sodium chloride 0.9 % 100 mL IVPB     100 mg 200 mL/hr over 30 Minutes Intravenous Daily 04/24/19 1100 04/28/19 0920   04/24/19 1200  remdesivir 200 mg in sodium chloride 0.9% 250 mL IVPB     200 mg 580 mL/hr over 30 Minutes Intravenous Once 04/24/19 1100 04/24/19 1900     Examination:  Awake Alert, Oriented X 3, No new F.N deficits, Normal affect Symmetrical Chest wall movement, Good air movement bilaterally, CTAB RRR,No Gallops,Rubs or new Murmurs, No Parasternal Heave +ve B.Sounds, Abd Soft, No tenderness, No rebound - guarding or rigidity. No Cyanosis, Clubbing or edema, No new Rash or bruise      I have personally reviewed the following labs and images: CBC: Recent Labs  Lab 04/25/19 0408 04/26/19 0422 04/27/19 0229 04/28/19 0249 04/29/19 0104  WBC 3.5* 4.5 5.0 6.5 7.7  NEUTROABS 2.7 3.3 3.8 4.5 6.4  HGB 14.0 14.1 14.0 14.4 14.4  HCT 41.4 43.6 42.6 42.6 43.0  MCV 88.3 92.0 90.1 88.0 87.8  PLT 165 220 259 268 299   BMP &GFR Recent Labs  Lab 04/25/19 0408 04/26/19 0422 04/27/19 0229 04/28/19 0249 04/29/19 0104  NA 142 139 140 140 139  K 4.4 4.3 4.3 4.2 4.1  CL 105 105 103 102 100  CO2 23 25  27 30 29   GLUCOSE 128* 126* 157* 140* 179*  BUN 15 19 22 23 22   CREATININE 0.71 0.56 0.62 0.62 0.62  CALCIUM 7.9* 8.4* 8.2* 8.3* 8.5*  PHOS  --   --   --   --  4.0   Estimated Creatinine Clearance: 63 mL/min (by C-G formula based on SCr of 0.62 mg/dL). Liver & Pancreas: Recent Labs  Lab 04/25/19 0408 04/26/19 0422 04/27/19 0229 04/28/19 0249 04/29/19 0104  AST 50* 41 54* 29 18  ALT 43 43 79* 66* 50*  ALKPHOS 68 66 66 65 66  BILITOT 1.0 0.8 0.8 0.9 1.0  PROT 6.2* 6.1* 5.8* 6.4* 6.1*  ALBUMIN 3.0* 3.1* 3.0* 3.4* 3.5   No results for input(s): LIPASE, AMYLASE in the last 168 hours. No results for input(s): AMMONIA in the last 168 hours. Diabetic: No results for  input(s): HGBA1C in the last 72 hours. Recent Labs  Lab 04/24/19 1128 04/24/19 1618 04/26/19 0802 04/27/19 2014  GLUCAP 107* 140* 125* 160*   Cardiac Enzymes: No results for input(s): CKTOTAL, CKMB, CKMBINDEX, TROPONINI in the last 168 hours. No results for input(s): PROBNP in the last 8760 hours. Coagulation Profile: No results for input(s): INR, PROTIME in the last 168 hours. Thyroid Function Tests: No results for input(s): TSH, T4TOTAL, FREET4, T3FREE, THYROIDAB in the last 72 hours. Lipid Profile: No results for input(s): CHOL, HDL, LDLCALC, TRIG, CHOLHDL, LDLDIRECT in the last 72 hours. Anemia Panel: Recent Labs    04/28/19 0249 04/29/19 0104  FERRITIN 686* 553*   Urine analysis:    Component Value Date/Time   COLORURINE YELLOW 08/19/2018 0917   APPEARANCEUR HAZY (A) 08/19/2018 0917   LABSPEC 1.018 08/19/2018 0917   PHURINE 5.0 08/19/2018 0917   GLUCOSEU NEGATIVE 08/19/2018 0917   HGBUR LARGE (A) 08/19/2018 0917   BILIRUBINUR NEGATIVE 08/19/2018 [place] 08/19/2018 0917   PROTEINUR NEGATIVE 08/19/2018 0917   NITRITE NEGATIVE 08/19/2018 0917   LEUKOCYTESUR MODERATE (A) 08/19/2018 0917   Sepsis Labs: Invalid input(s): PROCALCITONIN, [place]  Microbiology: Recent Results  (from the past 240 hour(s))  Novel Coronavirus, NAA ([facility])     Status: Abnormal   Collection Time: 04/20/19  9:55 AM   Specimen: Nasopharyngeal(NP) swabs in vial transport medium   NASOPHARYNGE  TESTING  Result Value Ref Range Status   SARS-CoV-2, NAA Detected (A) Not Detected Final    Comment: This nucleic acid amplification test was developed and its performance characteristics determined by [employer]. Nucleic acid amplification tests include PCR and TMA. This test has not been FDA cleared or approved. This test has been authorized by FDA under an Emergency Use Authorization (EUA). This test is only authorized for the duration of time the declaration that circumstances exist justifying the authorization of the emergency use of in vitro diagnostic tests for detection of SARS-CoV-2 virus and/or diagnosis of COVID-19 infection under section 564(b)(1) of the Act, 21 U.S.C. [id number]) (1), unless the authorization is terminated or revoked sooner. When diagnostic testing is negative, the possibility of a false negative result should be considered in the context of a patient's recent exposures and the presence of clinical signs and symptoms consistent with COVID-19. An individual without symptoms of COVID-19 and who is not shedding SARS-CoV-2 virus would  expect to have a negative (not detected) result in this assay.   Blood Culture (routine x 2)     Status: None (Preliminary result)   Collection Time: 04/23/19 10:35 PM   Specimen: BLOOD LEFT ARM  Result Value Ref Range Status   Specimen Description   Final    BLOOD LEFT ARM Performed at [facility], [address] [place], [place] [address]    Special Requests   Final    BOTTLES DRAWN AEROBIC AND ANAEROBIC Blood Culture adequate volume Performed at [facility], [address] [place], [place] [address]    Culture   Final    NO GROWTH 4 DAYS Performed at [facility], [place] [address], [place], [place] [address]    Report Status PENDING  Incomplete  Blood Culture (routine x 2)     Status: None (Preliminary result)   Collection Time: 04/23/19 10:45 PM   Specimen: BLOOD RIGHT FOREARM  Result Value Ref Range Status   Specimen Description   Final    BLOOD RIGHT FOREARM  Performed at [facility], [address] [place], [place] [address]    Special Requests   Final    BOTTLES DRAWN AEROBIC AND ANAEROBIC Blood Culture adequate volume Performed at [facility], [address] [place], [place] [address]    Culture   Final    NO GROWTH 4 DAYS Performed at [facility], [place] [address], [place], [place] [address]    Report Status PENDING  Incomplete    Radiology Studies: No results found.   Phillips Climes MD [facility]  If 7PM-7AM, please contact night-coverage [email or web address] Password [facility] 04/29/2019, 3:24 PM

## 2019-04-30 LAB — COMPREHENSIVE METABOLIC PANEL
ALT: 35 U/L (ref 0–44)
AST: 16 U/L (ref 15–41)
Albumin: 3.6 g/dL (ref 3.5–5.0)
Alkaline Phosphatase: 72 U/L (ref 38–126)
Anion gap: 12 (ref 5–15)
BUN: 23 mg/dL (ref 8–23)
CO2: 27 mmol/L (ref 22–32)
Calcium: 8.6 mg/dL — ABNORMAL LOW (ref 8.9–10.3)
Chloride: 98 mmol/L (ref 98–111)
Creatinine, Ser: 0.69 mg/dL (ref 0.44–1.00)
GFR calc Af Amer: >60 mL/min (ref >60)
GFR calc non Af Amer: >60 mL/min (ref >60)
Glucose, Bld: 124 mg/dL — ABNORMAL HIGH (ref 70–99)
Potassium: 3.9 mmol/L (ref 3.5–5.1)
Sodium: 137 mmol/L (ref 135–145)
Total Bilirubin: 1.1 mg/dL (ref 0.3–1.2)
Total Protein: 6.2 g/dL — ABNORMAL LOW (ref 6.5–8.1)

## 2019-04-30 LAB — CBC WITH DIFFERENTIAL/PLATELET
Abs Immature Granulocytes: 0.22 10*3/uL — ABNORMAL HIGH (ref 0.00–0.07)
Basophils Absolute: 0.1 10*3/uL (ref 0.0–0.1)
Basophils Relative: 0 %
Eosinophils Absolute: 0 10*3/uL (ref 0.0–0.5)
Eosinophils Relative: 0 %
HCT: 42.8 % (ref 36.0–46.0)
Hemoglobin: 14.8 g/dL (ref 12.0–15.0)
Immature Granulocytes: 2 %
Lymphocytes Relative: 7 %
Lymphs Abs: 1 10*3/uL (ref 0.7–4.0)
MCH: 30.1 pg (ref 26.0–34.0)
MCHC: 34.6 g/dL (ref 30.0–36.0)
MCV: 87.2 fL (ref 80.0–100.0)
Monocytes Absolute: 0.6 10*3/uL (ref 0.1–1.0)
Monocytes Relative: 4 %
Neutro Abs: 12.7 10*3/uL — ABNORMAL HIGH (ref 1.7–7.7)
Neutrophils Relative %: 87 %
Platelets: 289 10*3/uL (ref 150–400)
RBC: 4.91 MIL/uL (ref 3.87–5.11)
RDW: 13 % (ref 11.5–15.5)
WBC: 14.6 10*3/uL — ABNORMAL HIGH (ref 4.0–10.5)
nRBC: 0 % (ref 0.0–0.2)

## 2019-04-30 LAB — FERRITIN: Ferritin: 661 ng/mL — ABNORMAL HIGH (ref 11–307)

## 2019-04-30 LAB — C-REACTIVE PROTEIN: CRP: 0.5 mg/dL (ref <1.0)

## 2019-04-30 LAB — D-DIMER, QUANTITATIVE: D-Dimer, Quant: 0.71 ug/mL-FEU — ABNORMAL HIGH (ref 0.00–0.50)

## 2019-04-30 NOTE — Progress Notes (Signed)
PROGRESS NOTE  HA Peters [id number] DOB: 1955-04-17   PCP: Kathyrn Lass, MD  Patient is from: Home.  Independent for ADLs and IADLs at baseline.  DOA: 04/23/2019 LOS: 7  Brief Narrative / Interim history:  64 year old female with history of anxiety, depression and GERD presented to [facility] ED with worsening shortness of breath, fever, chills, cough and myalgia for 6 days.  She tested positive for COVID-19 on 04/20/2019.  In ED, febrile to 100.4.  Slightly tachycardic.  RR 18-33.  Slightly hypertensive.  Desaturated to 86% on RA requiring 2 L.  CBC and CMP normal.  CRP 8.3.  Lauren-Cal and LA within normal range.  CXR with scattered bilateral airspace opacities.  Admitted for acute respiratory failure with hypoxia due to COVID-19 infection.  Started on remdesivir and Decadron.   Patient was continued on remdesivir.  She received Actemra on 04/25/2019.  She was a switch from Decadron to Solu-Medrol.  However, she continued to require about 4 L oxygen to maintain appropriate saturation despite improvement in her inflammatory markers  Subjective:  Patient reports she is feeling better, cough has improved, dyspnea has improved, currently mainly with ambulation, but still reports that she is weak, appetite has been improving as well .  Objective: Vitals:   04/29/19 1615 04/29/19 2100 04/30/19 0400 04/30/19 0756  BP: 108/81 127/75 125/72 124/72  Pulse: (!) 108 88 88 88  Resp: 20  16 18   Temp: 98 F (36.7 C) 98.2 F (36.8 C) 99.2 F (37.3 C) 98.3 F (36.8 C)  TempSrc: Oral Oral Oral Oral  SpO2: 92% 97% 95% 96%  Weight:      Height:        Intake/Output Summary (Last 24 hours) at 04/30/2019 1707 Last data filed at 04/30/2019 1057 Gross per 24 hour  Intake 960 ml  Output 1000 ml  Net -40 ml   Filed Weights   04/23/19 2159  Weight: 70.3 kg      Assessment & Plan:  Acute respiratory failure with hypoxia due to COVID-19 pneumonia: -She remains on 2 L nasal cannula at rest,  requires up to 3 L with ambulation . -Treated with IV remdesivir. -Continue with IV steroids -Received Actemra 04/25/2019 -Continue to trend inflammatory markers closely, CRP has normalized which is reassuring. -I have discussed at length with the patient, encouraged her to use incentive spirometry, flutter valve, and to prone once possible, and to get out of bed to chair, patient with significant cough, have asked her to request her as needed cough medications more frequently.  Recent Labs    04/28/19 0249 04/29/19 0104 04/30/19 0335  DDIMER 0.79* 0.71* 0.71*  FERRITIN 686* 553* 661*  CRP <0.5 0.6 0.5   Elevated liver enzymes: -  Likely due to COVID-19 and remdesivir. -Continue monitoring.  Anxiety/depression: Stable -Continue Wellbutrin-might not be a great choice for anxiety but is stable.  htypertension -Blood pressure improved after starting on Norvasc  GERD: -PPI   Generalized weakness -PT/OT eval.  Pressure injury of right and left buttocks: stage I. POA Pressure Injury 04/24/19 Buttocks Right;Left Stage 1 -  Intact skin with non-blanchable redness of a localized area usually over a bony prominence. Reddness (Active)  04/24/19 1025  Location: Buttocks  Location Orientation: Right;Left  Staging: Stage 1 -  Intact skin with non-blanchable redness of a localized area usually over a bony prominence.  Wound Description (Comments): Reddness  Present on Admission: Yes             DVT  prophylaxis: Subcu Lovenox Code Status: Full code-confirmed. Family Communication: Discussed with patient Disposition Plan: 3 tomorrow home with home O2 Consultants: None   Microbiology summarized: 1/6-COVID-19 positive. Blood cultures negative.  Sch Meds:  Scheduled Meds: . amLODipine  5 mg Oral Daily  . vitamin C  500 mg Oral Daily  . benzonatate  100 mg Oral TID  . buPROPion  75 mg Oral BID  . docusate sodium  100 mg Oral BID  . enoxaparin (LOVENOX) injection  40 mg  Subcutaneous Q24H  . feeding supplement  1 Container Oral TID BM  . feeding supplement (ENSURE ENLIVE)  237 mL Oral BID BM  . guaiFENesin  1,200 mg Oral BID  . ipratropium  2 puff Inhalation TID  . methylPREDNISolone (SOLU-MEDROL) injection  40 mg Intravenous Q12H  . pantoprazole  40 mg Oral Daily  . senna  1 tablet Oral BID  . sodium chloride flush  3 mL Intravenous Q12H  . zinc sulfate  220 mg Oral Daily   Continuous Infusions:  PRN Meds:.acetaminophen, chlorpheniramine-HYDROcodone, guaiFENesin-dextromethorphan, HYDROcodone-acetaminophen, ondansetron (ZOFRAN) IV, phenol  Antimicrobials: Anti-infectives (From admission, onward)   Start     Dose/Rate Route Frequency Ordered Stop   04/27/19 1915  remdesivir 100 mg in sodium chloride 0.9 % 100 mL IVPB  Status:  Discontinued     100 mg 200 mL/hr over 30 Minutes Intravenous  Once 04/27/19 1913 04/27/19 1928   04/25/19 1000  remdesivir 100 mg in sodium chloride 0.9 % 100 mL IVPB     100 mg 200 mL/hr over 30 Minutes Intravenous Daily 04/24/19 1100 04/28/19 0920   04/24/19 1200  remdesivir 200 mg in sodium chloride 0.9% 250 mL IVPB     200 mg 580 mL/hr over 30 Minutes Intravenous Once 04/24/19 1100 04/24/19 1900     Examination:  Awake Alert, Oriented X 3, No new F.N deficits, Normal affect Symmetrical Chest wall movement, Good air movement bilaterally, CTAB RRR,No Gallops,Rubs or new Murmurs, No Parasternal Heave +ve B.Sounds, Abd Soft, No tenderness, No rebound - guarding or rigidity. No Cyanosis, Clubbing or edema, No new Rash or bruise       I have personally reviewed the following labs and images: CBC: Recent Labs  Lab 04/26/19 0422 04/27/19 0229 04/28/19 0249 04/29/19 0104 04/30/19 0335  WBC 4.5 5.0 6.5 7.7 14.6*  NEUTROABS 3.3 3.8 4.5 6.4 12.7*  HGB 14.1 14.0 14.4 14.4 14.8  HCT 43.6 42.6 42.6 43.0 42.8  MCV 92.0 90.1 88.0 87.8 87.2  PLT 220 259 268 299 289   BMP &GFR Recent Labs  Lab 04/26/19 0422  04/27/19 0229 04/28/19 0249 04/29/19 0104 04/30/19 0335  NA 139 140 140 139 137  K 4.3 4.3 4.2 4.1 3.9  CL 105 103 102 100 98  CO2 25 27 30 29 27   GLUCOSE 126* 157* 140* 179* 124*  BUN 19 22 23 22 23   CREATININE 0.56 0.62 0.62 0.62 0.69  CALCIUM 8.4* 8.2* 8.3* 8.5* 8.6*  PHOS  --   --   --  4.0  --    Estimated Creatinine Clearance: 63 mL/min (by C-G formula based on SCr of 0.69 mg/dL). Liver & Pancreas: Recent Labs  Lab 04/26/19 0422 04/27/19 0229 04/28/19 0249 04/29/19 0104 04/30/19 0335  AST 41 54* 29 18 16   ALT 43 79* 66* 50* 35  ALKPHOS 66 66 65 66 72  BILITOT 0.8 0.8 0.9 1.0 1.1  PROT 6.1* 5.8* 6.4* 6.1* 6.2*  ALBUMIN 3.1* 3.0* 3.4* 3.5 3.6  No results for input(s): LIPASE, AMYLASE in the last 168 hours. No results for input(s): AMMONIA in the last 168 hours. Diabetic: No results for input(s): HGBA1C in the last 72 hours. Recent Labs  Lab 04/24/19 1128 04/24/19 1618 04/26/19 0802 04/27/19 2014  GLUCAP 107* 140* 125* 160*   Cardiac Enzymes: No results for input(s): CKTOTAL, CKMB, CKMBINDEX, TROPONINI in the last 168 hours. No results for input(s): PROBNP in the last 8760 hours. Coagulation Profile: No results for input(s): INR, PROTIME in the last 168 hours. Thyroid Function Tests: No results for input(s): TSH, T4TOTAL, FREET4, T3FREE, THYROIDAB in the last 72 hours. Lipid Profile: No results for input(s): CHOL, HDL, LDLCALC, TRIG, CHOLHDL, LDLDIRECT in the last 72 hours. Anemia Panel: Recent Labs    04/29/19 0104 04/30/19 0335  FERRITIN 553* 661*   Urine analysis:    Component Value Date/Time   COLORURINE YELLOW 08/19/2018 0917   APPEARANCEUR HAZY (A) 08/19/2018 0917   LABSPEC 1.018 08/19/2018 0917   PHURINE 5.0 08/19/2018 0917   GLUCOSEU NEGATIVE 08/19/2018 0917   HGBUR LARGE (A) 08/19/2018 0917   BILIRUBINUR NEGATIVE 08/19/2018 [place] 08/19/2018 0917   PROTEINUR NEGATIVE 08/19/2018 0917   NITRITE NEGATIVE 08/19/2018  0917   LEUKOCYTESUR MODERATE (A) 08/19/2018 0917   Sepsis Labs: Invalid input(s): PROCALCITONIN, [place]  Microbiology: Recent Results (from the past 240 hour(s))  Blood Culture (routine x 2)     Status: None (Preliminary result)   Collection Time: 04/23/19 10:35 PM   Specimen: BLOOD LEFT ARM  Result Value Ref Range Status   Specimen Description   Final    BLOOD LEFT ARM Performed at [facility], [address] [place], [place] [address]    Special Requests   Final    BOTTLES DRAWN AEROBIC AND ANAEROBIC Blood Culture adequate volume Performed at [facility], [address] [place], [place] [address]    Culture   Final    NO GROWTH 4 DAYS Performed at [facility], [place] [address], [place], [place] [address]    Report Status PENDING  Incomplete  Blood Culture (routine x 2)     Status: None (Preliminary result)   Collection Time: 04/23/19 10:45 PM   Specimen: BLOOD RIGHT FOREARM  Result Value Ref Range Status   Specimen Description   Final    BLOOD RIGHT FOREARM Performed at [facility], [address] [place], [place] [address]    Special Requests   Final    BOTTLES DRAWN AEROBIC AND ANAEROBIC Blood Culture adequate volume Performed at [facility], [address] [place], [place] [address]    Culture   Final    NO GROWTH 4 DAYS Performed at [facility], [place] [address] [place], [place] [address]    Report Status PENDING  Incomplete    Radiology Studies: No results found.   Phillips Climes MD [facility]  If 7PM-7AM, please contact night-coverage [email or web address] Password [facility] 04/30/2019, 5:07 PM

## 2019-04-30 NOTE — Progress Notes (Signed)
SATURATION QUALIFICATIONS: (This note is used to comply with regulatory documentation for home oxygen)  Patient Saturations on Room Air at Rest = 91  Patient Saturations on Room Air while Ambulating = 80  Patient Saturations on 2 Liters of oxygen while Ambulating = 86  Please briefly explain why patient needs home oxygen:

## 2019-05-01 LAB — CBC WITH DIFFERENTIAL/PLATELET
Abs Immature Granulocytes: 0.27 10*3/uL — ABNORMAL HIGH (ref 0.00–0.07)
Basophils Absolute: 0 10*3/uL (ref 0.0–0.1)
Basophils Relative: 0 %
Eosinophils Absolute: 0 10*3/uL (ref 0.0–0.5)
Eosinophils Relative: 0 %
HCT: 42.5 % (ref 36.0–46.0)
Hemoglobin: 14.4 g/dL (ref 12.0–15.0)
Immature Granulocytes: 2 %
Lymphocytes Relative: 4 %
Lymphs Abs: 0.6 10*3/uL — ABNORMAL LOW (ref 0.7–4.0)
MCH: 29.5 pg (ref 26.0–34.0)
MCHC: 33.9 g/dL (ref 30.0–36.0)
MCV: 87.1 fL (ref 80.0–100.0)
Monocytes Absolute: 0.6 10*3/uL (ref 0.1–1.0)
Monocytes Relative: 3 %
Neutro Abs: 16.8 10*3/uL — ABNORMAL HIGH (ref 1.7–7.7)
Neutrophils Relative %: 91 %
Platelets: 289 10*3/uL (ref 150–400)
RBC: 4.88 MIL/uL (ref 3.87–5.11)
RDW: 12.8 % (ref 11.5–15.5)
WBC: 18.4 10*3/uL — ABNORMAL HIGH (ref 4.0–10.5)
nRBC: 0 % (ref 0.0–0.2)

## 2019-05-01 LAB — COMPREHENSIVE METABOLIC PANEL
ALT: 29 U/L (ref 0–44)
AST: 13 U/L — ABNORMAL LOW (ref 15–41)
Albumin: 3.5 g/dL (ref 3.5–5.0)
Alkaline Phosphatase: 71 U/L (ref 38–126)
Anion gap: 9 (ref 5–15)
BUN: 23 mg/dL (ref 8–23)
CO2: 28 mmol/L (ref 22–32)
Calcium: 8.6 mg/dL — ABNORMAL LOW (ref 8.9–10.3)
Chloride: 101 mmol/L (ref 98–111)
Creatinine, Ser: 0.61 mg/dL (ref 0.44–1.00)
GFR calc Af Amer: >60 mL/min (ref >60)
GFR calc non Af Amer: >60 mL/min (ref >60)
Glucose, Bld: 144 mg/dL — ABNORMAL HIGH (ref 70–99)
Potassium: 4.3 mmol/L (ref 3.5–5.1)
Sodium: 138 mmol/L (ref 135–145)
Total Bilirubin: 1.1 mg/dL (ref 0.3–1.2)
Total Protein: 6 g/dL — ABNORMAL LOW (ref 6.5–8.1)

## 2019-05-01 LAB — D-DIMER, QUANTITATIVE: D-Dimer, Quant: 0.54 ug/mL-FEU — ABNORMAL HIGH (ref 0.00–0.50)

## 2019-05-01 LAB — CULTURE, BLOOD (ROUTINE X 2)
Culture: NO GROWTH
Culture: NO GROWTH
Special Requests: ADEQUATE
Special Requests: ADEQUATE

## 2019-05-01 LAB — C-REACTIVE PROTEIN: CRP: 0.5 mg/dL (ref <1.0)

## 2019-05-01 LAB — FERRITIN: Ferritin: 574 ng/mL — ABNORMAL HIGH (ref 11–307)

## 2019-05-01 MED ORDER — AMLODIPINE BESYLATE 5 MG PO TABS: 5.0000 mg | ORAL_TABLET | Freq: Every day | ORAL | 0 refills | Status: DC

## 2019-05-01 MED ORDER — GUAIFENESIN ER 600 MG PO TB12: 1200.0000 mg | ORAL_TABLET | Freq: Two times a day (BID) | ORAL | 0 refills | Status: AC

## 2019-05-01 MED ORDER — DEXAMETHASONE 6 MG PO TABS: 6.0000 mg | ORAL_TABLET | Freq: Every day | ORAL | 0 refills | Status: DC

## 2019-05-01 NOTE — TOC Transition Note (Addendum)
Transition of Care [facility]) - CM/SW Discharge Note   Patient Details  Name: Lauren Peters MRN: [id number] Date of Birth: 1955/12/26  Transition of Care [facility]) CM/SW Contact:  Atilano Median, LCSW Phone Number: 05/01/2019, 10:39 AM   Clinical Narrative:    Discharged home with oxygen. Referral made to Hospital to Home program. Unable to get home health due to payor source.   Portable oxygen tank requested to be delivered to room by [facility] RN. Concentrator will be delivered to significant other Lauren Peters) home prior to discharge.   No other needs at this time. Case closed to this CSW.   Family states that her family will provide transport home.    Final next level of care: Home/Self Care Barriers to Discharge: Barriers Resolved   Patient Goals and CMS Choice Patient states their goals for this hospitalization and ongoing recovery are:: return home CMS Medicare.gov Compare Post Acute Care list provided to:: Patient Choice offered to / list presented to : Patient  Discharge Placement                       Discharge Plan and Services                DME Arranged: Oxygen DME Agency: [employer] Date DME Agency Contacted: 05/01/19 Time DME Agency Contacted: [phone] Representative spoke with at DME Agency: [place] Interventions     Readmission Risk Interventions No flowsheet data found.

## 2019-05-01 NOTE — Discharge Summary (Signed)
Lauren Peters, is a 64 y.o. female  DOB 1955/08/04  MRN [id number].  Admission date:  04/23/2019  Admitting Physician  Paticia Stack, MD  Discharge Date:  05/01/2019   Primary MD  Kathyrn Lass, MD  Recommendations for primary care physician for things to follow:  -Please check CBC, CMP during next visit   Admission Diagnosis  Hypoxia [R09.02] Pneumonia due to COVID-19 virus [U07.1, J12.82] COVID-19 [U07.1]   Discharge Diagnosis  Hypoxia [R09.02] Pneumonia due to COVID-19 virus [U07.1, J12.82] COVID-19 [U07.1]    Principal Problem:   Pneumonia due to COVID-19 virus Active Problems:   Sepsis due to COVID-19 ([place])   Hypoxia   Thrombocytopenia ([place])   Anxiety and depression   Pressure injury of skin      Past Medical History:  Diagnosis Date  . Acid reflux   . Anxiety   . Depression   . History of kidney stones     Past Surgical History:  Procedure Laterality Date  . ABDOMINAL HYSTERECTOMY     over 30 years ago  . EXTRACORPOREAL SHOCK WAVE LITHOTRIPSY Left 08/23/2018   Procedure: EXTRACORPOREAL SHOCK WAVE LITHOTRIPSY (ESWL);  Surgeon: Franchot Gallo, MD;  Location: [facility];  Service: Urology;  Laterality: Left;  . HAND SURGERY Right    x2 on right hand, carpal tunnel, bone removed  . LITHOTRIPSY  2007  . REDUCTION MAMMAPLASTY     20 years ago       History of present illness and  Hospital Course:     Kindly see H&P for history of present illness and admission details, please review complete Labs, Consult reports and Test reports for all details in brief  HPI  from the history and physical done on the day of admission 04/24/2019  HPI: Lauren Peters is a 64 y.o. female with medical history significant of GERD and anxiety, who started to have Covid symptoms last Saturday (6 days ago) then tested covid positive this past Wednesday (4 days ago) as outpatient, who presented to [facility]  yesterday with worsening SOB, associated with fever, chills, cough, body aches. No loss of taste or smell. No chest pain, abdominal pain or diarrhea. She lost appetite and felt generalized weakness. Yesterday she was so SOB to a point that she could not even carry any normal conversation. She does not smoke or routinely drink alcohol. She has no underlying cardiopulmonary diseases. She's been exposed to a friend who recently tested Covid positive as well.   ED Course:  Tm 100.4, HR 95-123, RR 18-33, Bp 141/90-173/92, she's hypoxic with 86% O2 on RA, and currently on 2L [place] with normal O2 sat. Normal CMP and CBC. CRP 8.3, ferritin 978, procalcitonin 0.11 normal, lactic acid 0.9, normal; D-dimer 0.68. CXR showed scattered bilateral airspace opacities bilaterally c/w viral pneumonia. Given her covid pneumonia with sepsis and hypoxia, pt was transferred from [facility] to [facility] for further management.    Hospital Course   Acute respiratory failure with hypoxia due to COVID-19 pneumonia: -She remains on 2  L nasal cannula at rest, requires up to 3 L with ambulation .  Home oxygen has been arranged for discharge -Treated with IV remdesivir. -She did with IV steroids during hospital stay, she will be discharged on another 4 days of Decadron for total of 10 days treatment -Received Actemra 04/25/2019 -CRP has normalized, I have encouraged her to take her incentive spirometry and flutter valve at home and keep using them.  Recent Labs (last 2 labs)        Recent Labs    04/28/19 0249 04/29/19 0104 04/30/19 0335  DDIMER 0.79* 0.71* 0.71*  FERRITIN 686* 553* 661*  CRP <0.5 0.6 0.5     Elevated liver enzymes: -  Likely due to COVID-19 and remdesivir.  LFTs normalized by time of discharge  Anxiety/depression: Stable -Continue Wellbutrin  htypertension -Blood pressure improved after starting on Norvasc  GERD: -PPI   Generalized weakness -PT/OT   Pressure injury of right and left buttocks:  stage I. POA Pressure Injury 04/24/19 Buttocks Right;Left Stage 1 -  Intact skin with non-blanchable redness of a localized area usually over a bony prominence. Reddness (Active)  04/24/19 1025  Location: Buttocks  Location Orientation: Right;Left  Staging: Stage 1 -  Intact skin with non-blanchable redness of a localized area usually over a bony prominence.  Wound Description (Comments): Reddness  Present on Admission: Yes      Discharge Condition:  Stable   Follow UP  Follow-up Information    Kathyrn Lass, MD Follow up in 10 day(s).   Specialty: Family Medicine Contact information: [place] [address] [phone]             Discharge Instructions  and  Discharge Medications    Discharge Instructions    MyChart COVID-19 home monitoring program   Complete by: Apr 26, 2019    Is the patient willing to use the [facility] for home monitoring?: Yes   Temperature monitoring   Complete by: Apr 26, 2019    After how many days would you like to receive a notification of this patient's flowsheet entries?: 1   Diet general   Complete by: As directed    Discharge instructions   Complete by: As directed    Follow with Primary MD Kathyrn Lass, MD in 14 days   Get CBC, CMP, 2 view Chest X ray checked  by Primary MD next visit.    Activity: As tolerated with Full fall precautions use walker/cane & assistance as needed   Disposition Home    Diet: Heart Healthy , with feeding assistance and aspiration precautions.  For Heart failure patients - Check your Weight same time everyday, if you gain over 2 pounds, or you develop in leg swelling, experience more shortness of breath or chest pain, call your Primary MD immediately. Follow Cardiac Low Salt Diet and 1.5 lit/day fluid restriction.   On your next visit with your primary care physician please Get Medicines reviewed and adjusted.   Please request your Prim.MD to go over all Hospital  Tests and Procedure/Radiological results at the follow up, please get all Hospital records sent to your Prim MD by signing hospital release before you go home.   If you experience worsening of your admission symptoms, develop shortness of breath, life threatening emergency, suicidal or homicidal thoughts you must seek medical attention immediately by calling 911 or calling your MD immediately  if symptoms less severe.  You Must read complete instructions/literature along with all the possible  adverse reactions/side effects for all the Medicines you take and that have been prescribed to you. Take any new Medicines after you have completely understood and accpet all the possible adverse reactions/side effects.   Do not drive, operating heavy machinery, perform activities at heights, swimming or participation in water activities or provide baby sitting services if your were admitted for syncope or siezures until you have seen by Primary MD or a Neurologist and advised to do so again.  Do not drive when taking Pain medications.    Do not take more than prescribed Pain, Sleep and Anxiety Medications  Special Instructions: If you have smoked or chewed Tobacco  in the last 2 yrs please stop smoking, stop any regular Alcohol  and or any Recreational drug use.  Wear Seat belts while driving.   Please note  You were cared for by a hospitalist during your hospital stay. If you have any questions about your discharge medications or the care you received while you were in the hospital after you are discharged, you can call the unit and asked to speak with the hospitalist on call if the hospitalist that took care of you is not available. Once you are discharged, your primary care physician will handle any further medical issues. Please note that NO REFILLS for any discharge medications will be authorized once you are discharged, as it is imperative that you return to your primary care physician (or establish  a relationship with a primary care physician if you do not have one) for your aftercare needs so that they can reassess your need for medications and monitor your lab values.   Increase activity slowly   Complete by: As directed      Allergies as of 05/01/2019      Reactions   Albuterol Other (See Comments)   Hot flashes/ flush/    Sulfamethoxazole Rash      Medication List    STOP taking these medications   naproxen sodium 220 MG tablet Commonly known as: ALEVE     TAKE these medications   acetaminophen 325 MG tablet Commonly known as: TYLENOL Take 325 mg by mouth every 6 (six) hours as needed for mild pain or headache.   amLODipine 5 MG tablet Commonly known as: NORVASC Take 1 tablet (5 mg total) by mouth daily. Start taking on: May 02, 2019   ascorbic acid 500 MG tablet Commonly known as: VITAMIN C Take 1 tablet (500 mg total) by mouth daily.   benzonatate 100 MG capsule Commonly known as: TESSALON Take 100 mg by mouth 3 (three) times daily.   buPROPion 75 MG tablet Commonly known as: WELLBUTRIN Take 75 mg by mouth 2 (two) times daily.   chlorpheniramine-HYDROcodone 10-8 MG/5ML Suer Commonly known as: TUSSIONEX Take 5 mLs by mouth every 12 (twelve) hours as needed for cough.   dexamethasone 6 MG tablet Commonly known as: DECADRON Take 1 tablet (6 mg total) by mouth daily. Start taking on: May 02, 2019   guaiFENesin 600 MG 12 hr tablet Commonly known as: MUCINEX Take 2 tablets (1,200 mg total) by mouth 2 (two) times daily for 10 days.   omeprazole 40 MG capsule Commonly known as: PRILOSEC Take 40 mg by mouth daily.   ondansetron 4 MG tablet Commonly known as: ZOFRAN Take 4 mg by mouth every 8 (eight) hours as needed for nausea or vomiting.   zinc sulfate 220 (50 Zn) MG capsule Take 1 capsule (220 mg total) by mouth daily.  Durable Medical Equipment  (From admission, onward)         Start     Ordered   04/30/19 1528  DME  Oxygen  (Discharge Planning)  Once    Question Answer Comment  Length of Need 6 Months   Mode or (Route) Nasal cannula   Liters per Minute 2   Frequency Continuous (stationary and portable oxygen unit needed)   Oxygen conserving device Yes   Oxygen delivery system Gas      04/30/19 1528            Diet and Activity recommendation: See Discharge Instructions above   Consults obtained - None   Major procedures and Radiology Reports - PLEASE review detailed and final reports for all details, in brief -      DG Chest Port 1 View  Result Date: 04/23/2019 CLINICAL DATA:  Shortness of breath with COVID-19. EXAM: PORTABLE CHEST 1 VIEW COMPARISON:  April 08, 2016 FINDINGS: There is scattered airspace opacities bilaterally. The heart size is borderline enlarged. There is no pneumothorax. No large pleural effusion. There is no acute osseous abnormality. IMPRESSION: Scattered bilateral airspace opacities consistent with the patient's history of viral pneumonia. Electronically Signed   By: Constance Holster M.D.   On: 04/23/2019 23:04    Micro Results     Recent Results (from the past 240 hour(s))  Blood Culture (routine x 2)     Status: None (Preliminary result)   Collection Time: 04/23/19 10:35 PM   Specimen: BLOOD LEFT ARM  Result Value Ref Range Status   Specimen Description   Final    BLOOD LEFT ARM Performed at [facility], [address] [place], [place] [address]    Special Requests   Final    BOTTLES DRAWN AEROBIC AND ANAEROBIC Blood Culture adequate volume Performed at [facility], [address], [place], [place] [address]    Culture   Final    NO GROWTH 4 DAYS Performed at [facility], [place] [address], [place], [place] [address]    Report Status PENDING  Incomplete  Blood Culture (routine x 2)     Status: None (Preliminary result)   Collection Time: 04/23/19 10:45 PM   Specimen: BLOOD RIGHT FOREARM  Result Value Ref Range  Status   Specimen Description   Final    BLOOD RIGHT FOREARM Performed at [facility] [address] [place], [place] [address]    Special Requests   Final    BOTTLES DRAWN AEROBIC AND ANAEROBIC Blood Culture adequate volume Performed at [facility], [address] [place], [place] [address]    Culture   Final    NO GROWTH 4 DAYS Performed at [facility], [place] [address] [place], [place] [address]    Report Status PENDING  Incomplete       Today   Subjective:   Lauren Peters today has no headache,no chest or abdominal pain ,reports minimal dyspnea on exertion, reports cough improved, but still present, minimally productive Objective:   Blood pressure 129/85, pulse 85, temperature 99 F (37.2 C), temperature source Oral, resp. rate 19, height 5' (1.524 m), weight 70.3 kg, SpO2 96 %.   Intake/Output Summary (Last 24 hours) at 05/01/2019 1158 Last data filed at 05/01/2019 0800 Gross per 24 hour  Intake 480 ml  Output 1500 ml  Net -1020 ml    Exam Awake Alert, Oriented x 3, No  new F.N deficits, Normal affect Symmetrical Chest wall movement, Good air movement bilaterally, CTAB RRR,No Gallops,Rubs or new Murmurs, No Parasternal Heave +ve B.Sounds, Abd Soft, Non tender,  No rebound -guarding or rigidity. No Cyanosis, Clubbing or edema, No new Rash or bruise  Data Review   CBC w Diff:  Lab Results  Component Value Date   WBC 18.4 (H) 05/01/2019   HGB 14.4 05/01/2019   HCT 42.5 05/01/2019   PLT 289 05/01/2019   LYMPHOPCT 4 05/01/2019   MONOPCT 3 05/01/2019   EOSPCT 0 05/01/2019   BASOPCT 0 05/01/2019    CMP:  Lab Results  Component Value Date   NA 138 05/01/2019   K 4.3 05/01/2019   CL 101 05/01/2019   CO2 28 05/01/2019   BUN 23 05/01/2019   CREATININE 0.61 05/01/2019   PROT 6.0 (L) 05/01/2019   ALBUMIN 3.5 05/01/2019   BILITOT 1.1 05/01/2019   ALKPHOS 71 05/01/2019   AST 13 (L) 05/01/2019   ALT 29 05/01/2019  .   Total  Time in preparing paper work, data evaluation and todays exam - 75 minutes  Phillips Climes M.D on 05/01/2019 at 11:58 AM  [facility]   Office  [phone]

## 2019-05-01 NOTE — Progress Notes (Signed)
Discharged to home in care of self. AVS given and explained. Patient is alert and oriented x 4.Patient states understanding and will comply. No distress seen. No abnormal behaviors seen. Denies pain at this time. IV's removed without incident. Taken to front lobby via wheelchair as per hospital policy.

## 2019-05-09 DIAGNOSIS — A4189 Other specified sepsis: Secondary | ICD-10-CM | POA: Diagnosis not present

## 2019-05-09 DIAGNOSIS — U071 COVID-19: Secondary | ICD-10-CM | POA: Diagnosis not present

## 2019-05-09 DIAGNOSIS — J1282 Pneumonia due to coronavirus disease 2019: Secondary | ICD-10-CM | POA: Diagnosis not present

## 2019-05-23 DIAGNOSIS — U071 COVID-19: Secondary | ICD-10-CM | POA: Diagnosis not present

## 2019-05-23 DIAGNOSIS — J1282 Pneumonia due to coronavirus disease 2019: Secondary | ICD-10-CM | POA: Diagnosis not present

## 2019-06-01 DIAGNOSIS — U071 COVID-19: Secondary | ICD-10-CM | POA: Diagnosis not present

## 2019-06-17 DIAGNOSIS — U071 COVID-19: Secondary | ICD-10-CM | POA: Diagnosis not present

## 2019-06-17 DIAGNOSIS — J1282 Pneumonia due to coronavirus disease 2019: Secondary | ICD-10-CM | POA: Diagnosis not present

## 2019-06-28 ENCOUNTER — Other Ambulatory Visit: Payer: Self-pay

## 2019-06-28 ENCOUNTER — Ambulatory Visit
Admission: RE | Admit: 2019-06-28 | Discharge: 2019-06-28 | Disposition: A | Discharge status: Home / Self Care | Payer: BC Managed Care – PPO | Source: Ambulatory Visit | Attending: Family Medicine | Admitting: Family Medicine

## 2019-06-28 ENCOUNTER — Other Ambulatory Visit: Payer: Self-pay | Admitting: Family Medicine

## 2019-06-28 DIAGNOSIS — R0602 Shortness of breath: Secondary | ICD-10-CM

## 2019-06-28 DIAGNOSIS — R918 Other nonspecific abnormal finding of lung field: Secondary | ICD-10-CM | POA: Diagnosis not present

## 2019-06-28 MED ORDER — IOPAMIDOL (ISOVUE-370) INJECTION 76%
75.0000 mL | Freq: Once | INTRAVENOUS | Status: AC | PRN
  Administered 2019-06-28: 75 mL via INTRAVENOUS

## 2019-06-29 DIAGNOSIS — U071 COVID-19: Secondary | ICD-10-CM | POA: Diagnosis not present

## 2019-07-26 DIAGNOSIS — R0602 Shortness of breath: Secondary | ICD-10-CM | POA: Diagnosis not present

## 2019-07-26 DIAGNOSIS — U071 COVID-19: Secondary | ICD-10-CM | POA: Diagnosis not present

## 2019-07-26 DIAGNOSIS — R05 Cough: Secondary | ICD-10-CM | POA: Diagnosis not present

## 2019-07-30 DIAGNOSIS — U071 COVID-19: Secondary | ICD-10-CM | POA: Diagnosis not present

## 2019-08-09 DIAGNOSIS — Z13820 Encounter for screening for osteoporosis: Secondary | ICD-10-CM | POA: Diagnosis not present

## 2019-08-09 DIAGNOSIS — Z01419 Encounter for gynecological examination (general) (routine) without abnormal findings: Secondary | ICD-10-CM | POA: Diagnosis not present

## 2019-08-09 DIAGNOSIS — Z6831 Body mass index (BMI) 31.0-31.9, adult: Secondary | ICD-10-CM | POA: Diagnosis not present

## 2019-08-24 ENCOUNTER — Encounter: Payer: Self-pay | Admitting: Pulmonary Disease

## 2019-08-24 ENCOUNTER — Other Ambulatory Visit: Payer: Self-pay

## 2019-08-24 ENCOUNTER — Ambulatory Visit: Payer: BC Managed Care – PPO | Admitting: Pulmonary Disease

## 2019-08-24 VITALS — BP 130/84 | HR 97 | Temp 97.3°F | Ht 60.0 in | Wt 163.0 lb

## 2019-08-24 DIAGNOSIS — R0600 Dyspnea, unspecified: Secondary | ICD-10-CM

## 2019-08-24 DIAGNOSIS — U071 COVID-19: Secondary | ICD-10-CM | POA: Diagnosis not present

## 2019-08-24 DIAGNOSIS — J1282 Pneumonia due to coronavirus disease 2019: Secondary | ICD-10-CM | POA: Diagnosis not present

## 2019-08-24 NOTE — Progress Notes (Signed)
Lauren Peters    EE:[id number]    [birth year]  Primary Care Physician:Miller, Lattie Haw, MD  Referring Physician: Kathyrn Lass, MD [place],  [place] [id number]  Chief complaint: Follow-up for post COVID-52  HPI: 64 year old with GERD, anxiety, depression diagnosed with COVID-19 on 04/20/2019.  She was admitted for 8 days treated with remdesivir, steroids, Actemra. Did not require intubation.  Discharged on 2 L oxygen which she been himself off She continues to have dyspnea on exertion post discharge with cough.  She had CTA done by primary care recently with no PE but shows peripheral mild opacities.  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  No mold, hot tub, Jacuzzi.  No down pillows or comforters Smoking history: Never smoker Travel history: Previously lived in [place].  No significant recent travel Relevant family history: No significant family history of lung disease   Outpatient Encounter Medications as of 08/24/2019  Medication Sig  . acetaminophen (TYLENOL) 325 MG tablet Take 325 mg by mouth every 6 (six) hours as needed for mild pain or headache.  Marland Kitchen ascorbic acid (VITAMIN C) 500 MG tablet Take 1 tablet (500 mg total) by mouth daily.  . benzonatate (TESSALON) 100 MG capsule Take 100 mg by mouth 3 (three) times daily.  Marland Kitchen buPROPion (WELLBUTRIN) 75 MG tablet Take 75 mg by mouth 2 (two) times daily.   . chlorpheniramine-HYDROcodone (TUSSIONEX) 10-8 MG/5ML SUER Take 5 mLs by mouth every 12 (twelve) hours as needed for cough.  . dexamethasone (DECADRON) 6 MG tablet Take 1 tablet (6 mg total) by mouth daily.  Marland Kitchen omeprazole (PRILOSEC) 40 MG capsule Take 40 mg by mouth daily.   Marland Kitchen zinc sulfate 220 (50 Zn) MG capsule Take 1 capsule (220 mg total) by mouth daily.  . [DISCONTINUED] amLODipine (NORVASC) 5 MG tablet Take 1 tablet (5 mg total) by mouth daily.  . [DISCONTINUED] ondansetron (ZOFRAN) 4 MG  tablet Take 4 mg by mouth every 8 (eight) hours as needed for nausea or vomiting.   No facility-administered encounter medications on file as of 08/24/2019.    Allergies as of 08/24/2019 - Review Complete 08/24/2019  Allergen Reaction Noted  . Albuterol Other (See Comments) 05/07/2016  . Sulfamethoxazole Rash 05/07/2016    Past Medical History:  Diagnosis Date  . Acid reflux   . Anxiety   . Depression   . History of kidney stones     Past Surgical History:  Procedure Laterality Date  . ABDOMINAL HYSTERECTOMY     over 30 years ago  . EXTRACORPOREAL SHOCK WAVE LITHOTRIPSY Left 08/23/2018   Procedure: EXTRACORPOREAL SHOCK WAVE LITHOTRIPSY (ESWL);  Surgeon: Franchot Gallo, MD;  Location: [facility];  Service: Urology;  Laterality: Left;  . HAND SURGERY Right    x2 on right hand, carpal tunnel, bone removed  . LITHOTRIPSY  2007  . REDUCTION MAMMAPLASTY     20 years ago    Family History  Problem Relation Age of Onset  . Breast cancer Other     Social History   Socioeconomic History  . Marital status: Widowed    Spouse name: Not on file  . Number of children: Not on file  . Years of education: Not on file  . Highest education level: Not on file  Occupational History  . Not on file  Tobacco Use  . Smoking status: Never Smoker  . Smokeless tobacco:  Never Used  Substance and Sexual Activity  . Alcohol use: Yes    Comment: 2x week  . Drug use: Never  . Sexual activity: Not on file  Other Topics Concern  . Not on file  Social History Narrative  . Not on file   Social Determinants of Health   Financial Resource Strain:   . Difficulty of Paying Living Expenses:   Food Insecurity:   . Worried About Charity fundraiser in the Last Year:   . Arboriculturist in the Last Year:   Transportation Needs:   . Film/video editor (Medical):   Marland Kitchen Lack of Transportation (Non-Medical):   Physical Activity:   . Days of Exercise per Week:   . Minutes of Exercise per  Session:   Stress:   . Feeling of Stress :   Social Connections:   . Frequency of Communication with Friends and Family:   . Frequency of Social Gatherings with Friends and Family:   . Attends Religious Services:   . Active Member of Clubs or Organizations:   . Attends Archivist Meetings:   Marland Kitchen Marital Status:   Intimate Partner Violence:   . Fear of Current or Ex-Partner:   . Emotionally Abused:   Marland Kitchen Physically Abused:   . Sexually Abused:     Review of systems: Review of Systems  Constitutional: Negative for fever and chills.  HENT: Negative.   Eyes: Negative for blurred vision.  Respiratory: as per HPI  Cardiovascular: Negative for chest pain and palpitations.  Gastrointestinal: Negative for vomiting, diarrhea, blood per rectum. Genitourinary: Negative for dysuria, urgency, frequency and hematuria.  Musculoskeletal: Negative for myalgias, back pain and joint pain.  Skin: Negative for itching and rash.  Neurological: Negative for dizziness, tremors, focal weakness, seizures and loss of consciousness.  Endo/Heme/Allergies: Negative for environmental allergies.  Psychiatric/Behavioral: Negative for depression, suicidal ideas and hallucinations.  All other systems reviewed and are negative.  Physical Exam: Blood pressure 130/84, pulse 97, temperature (!) 97.3 F (36.3 C), temperature source Temporal, height 5' (1.524 m), weight 163 lb (73.9 kg), SpO2 97 %. Gen:      No acute distress HEENT:  EOMI, sclera anicteric Neck:     No masses; no thyromegaly Lungs:    Clear to auscultation bilaterally; normal respiratory effort CV:         Regular rate and rhythm; no murmurs Abd:      + bowel sounds; soft, non-tender; no palpable masses, no distension Ext:    No edema; adequate peripheral perfusion Skin:      Warm and dry; no rash Neuro: alert and oriented x 3 Psych: normal mood and affect  Data Reviewed: Imaging: CTA 06/28/2019-no pulmonary embolism, peripheral  interstitial thickening, mild groundglass in alternate pattern. I have reviewed the images personally.  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 I do not believe she will require additional steroids at this point She will benefit from pulmonary rehab due to persistent symptoms of dyspnea and is requesting a letter so that she can get time off from work Schedule 6-minute walk test baseline evaluation.  We recommended pulmonary function test but she does not want to take the required Covid test and quarantine herself for the procedure.  Follow-up CT in 6 months for monitoring  Plan/Recommendations: 6-minute walk Pulmonary rehab Follow-up in 3 months.  Marshell Garfinkel MD  Pulmonary and Critical Care 08/24/2019, 3:29  PM  CC: Kathyrn Lass, MD

## 2019-08-24 NOTE — Addendum Note (Signed)
Addended by: June Leap on: 08/24/2019 04:10 PM   Modules accepted: Orders

## 2019-08-24 NOTE — Addendum Note (Signed)
Addended by: June Leap on: 08/24/2019 04:23 PM   Modules accepted: Orders

## 2019-08-24 NOTE — Patient Instructions (Signed)
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

## 2019-08-25 ENCOUNTER — Telehealth: Payer: Self-pay | Admitting: Pulmonary Disease

## 2019-08-25 ENCOUNTER — Telehealth (HOSPITAL_COMMUNITY): Payer: Self-pay

## 2019-08-25 DIAGNOSIS — R0600 Dyspnea, unspecified: Secondary | ICD-10-CM

## 2019-08-25 NOTE — Telephone Encounter (Signed)
Pt insurance is active and benefits verified through [employer] Co-pay 0, DED $500/$500 met, out of pocket $1,500/$644.12 met, co-insurance 10%. no pre-authorization required. Passport, [place]/[employer] 08/25/2019_0 :18pm, REF# [id number]

## 2019-08-25 NOTE — Telephone Encounter (Signed)
New order has been placed. Called pulm rehab and spoke with Janett Billow letting her know this had been done and she verbalized understanding. Nothing further needed.

## 2019-09-07 ENCOUNTER — Telehealth (HOSPITAL_COMMUNITY): Payer: Self-pay | Admitting: *Deleted

## 2019-09-14 IMAGING — CR ABDOMEN - 1 VIEW
2 series · 2 of 2 positions shown · non-contrast
Comparison: 08/19/2018 CT abdomen/pelvis

CLINICAL DATA: Left UPJ stone, pre-lithotripsy

EXAM:
ABDOMEN - 1 VIEW

[t abdomen supine (1 of 2)]
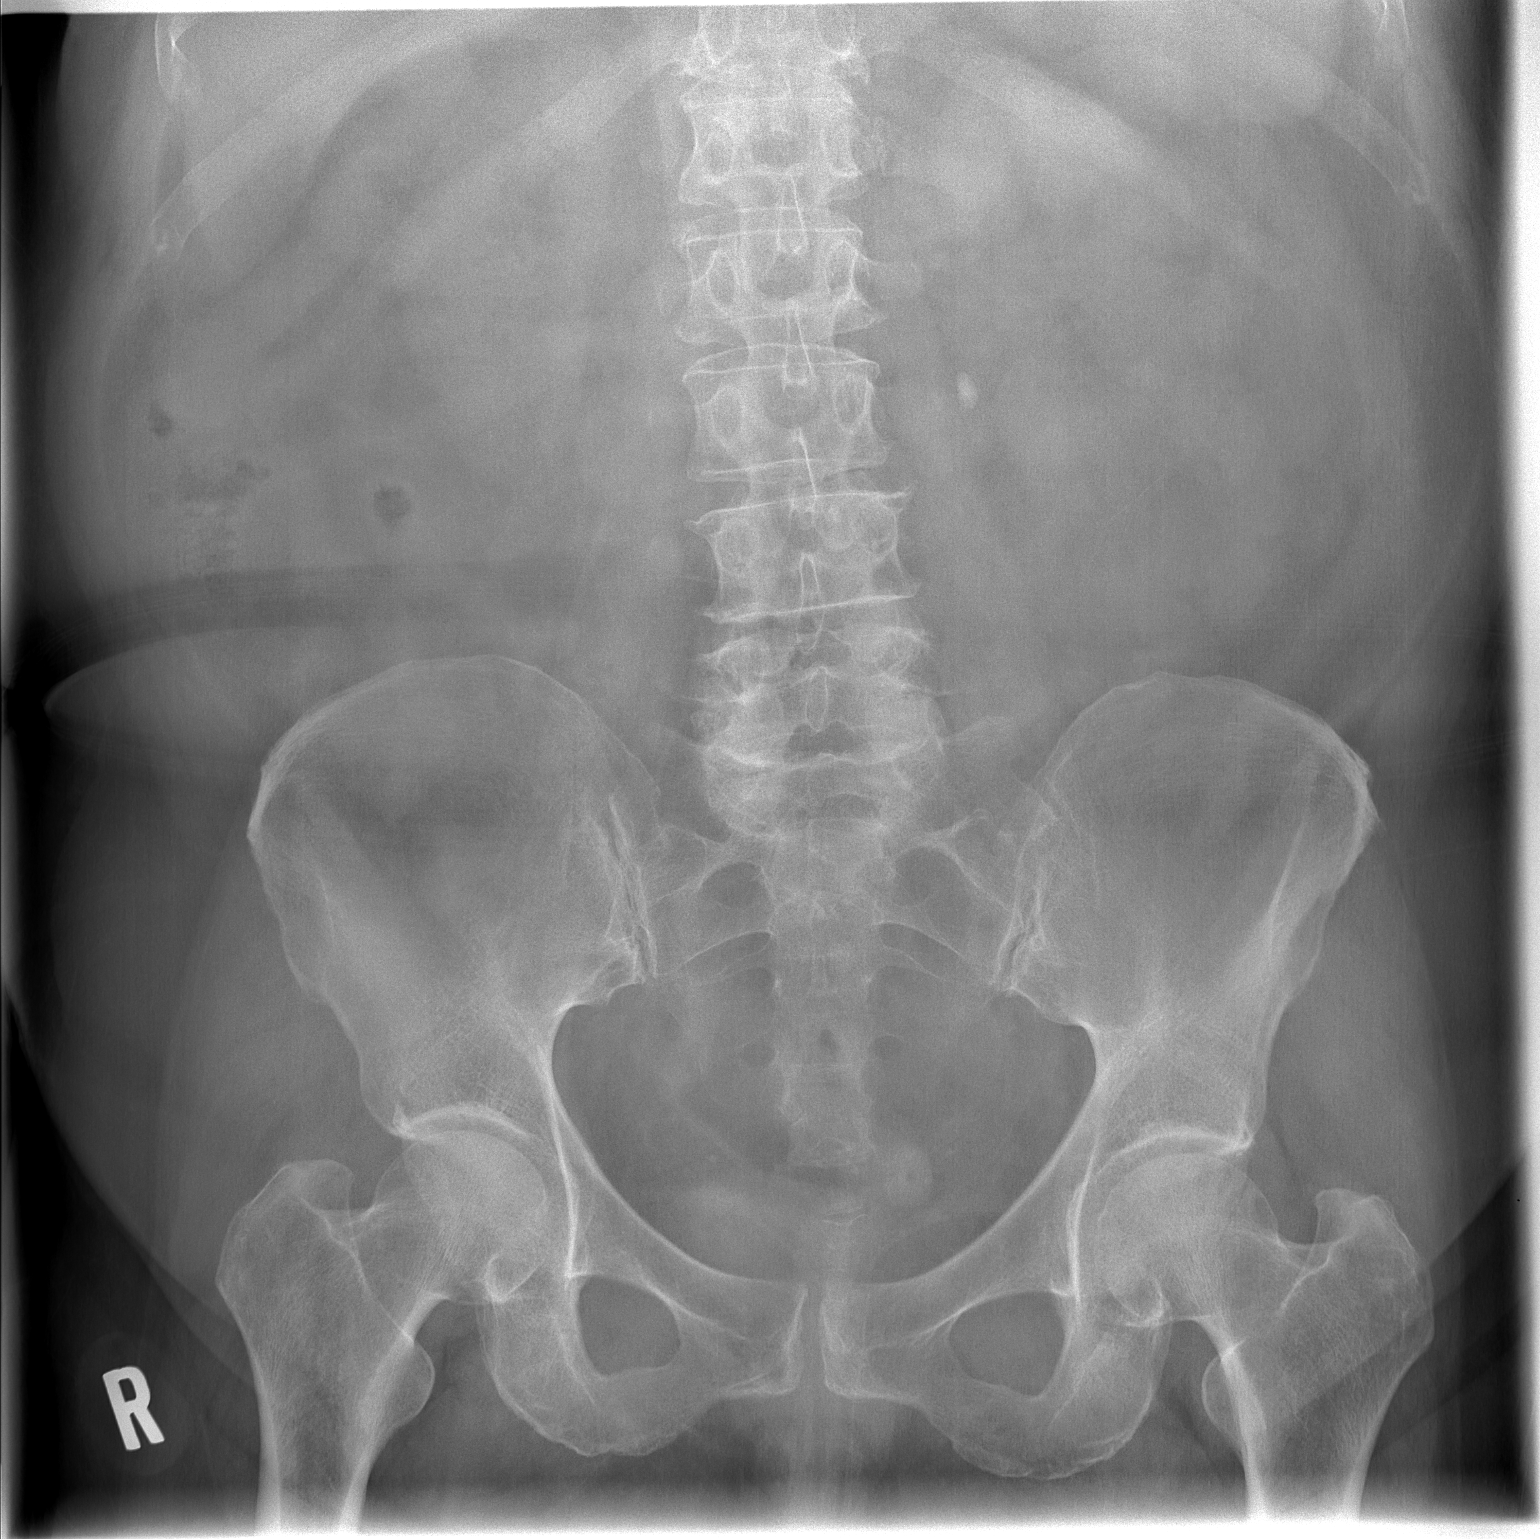

[t abdomen supine (2 of 2)]
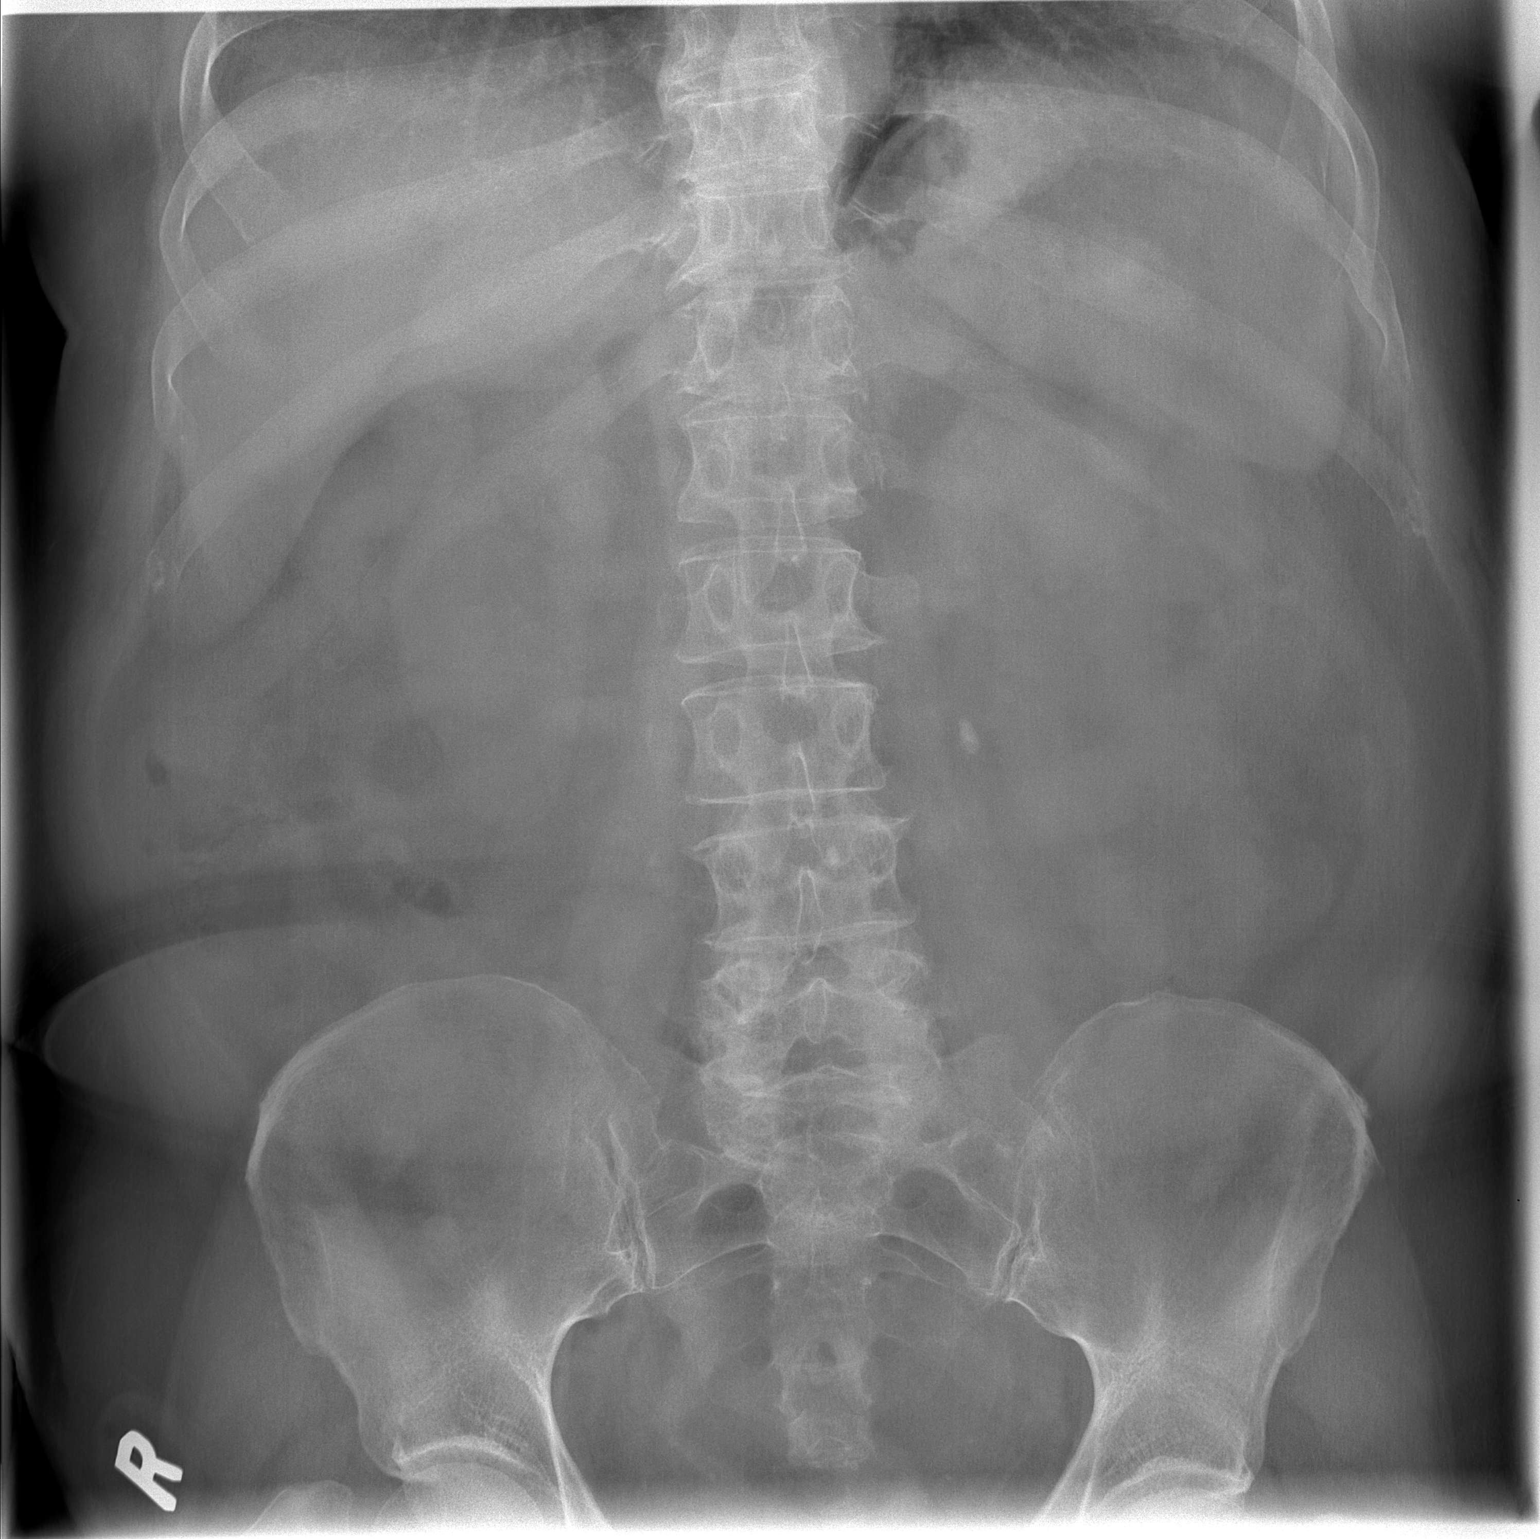

[2 of 2 positions shown; findings below may reference images not displayed]

FINDINGS: A 9 mm stone overlies the expected location of the left
ureteropelvic junction, unchanged, at the level of the L3 vertebral
body. No additional radiopaque stones overlying the kidneys, ureters
or bladder. No dilated small bowel loops. No evidence of pneumatosis
or pneumoperitoneum. Clear lung bases. Moderate lower lumbar
spondylosis.
IMPRESSION: Persistent 9 mm left UPJ stone.

## 2019-09-29 DIAGNOSIS — M8588 Other specified disorders of bone density and structure, other site: Secondary | ICD-10-CM | POA: Diagnosis not present

## 2019-09-29 DIAGNOSIS — M818 Other osteoporosis without current pathological fracture: Secondary | ICD-10-CM | POA: Diagnosis not present

## 2019-09-29 DIAGNOSIS — Z1231 Encounter for screening mammogram for malignant neoplasm of breast: Secondary | ICD-10-CM | POA: Diagnosis not present

## 2019-10-03 DIAGNOSIS — N390 Urinary tract infection, site not specified: Secondary | ICD-10-CM | POA: Diagnosis not present

## 2019-10-27 ENCOUNTER — Telehealth (HOSPITAL_COMMUNITY): Payer: Self-pay

## 2019-11-22 ENCOUNTER — Telehealth (HOSPITAL_COMMUNITY): Payer: Self-pay

## 2019-11-24 ENCOUNTER — Telehealth (HOSPITAL_COMMUNITY): Payer: Self-pay

## 2019-11-24 ENCOUNTER — Ambulatory Visit (HOSPITAL_COMMUNITY): Payer: BC Managed Care – PPO

## 2019-12-07 ENCOUNTER — Telehealth (HOSPITAL_COMMUNITY): Payer: Self-pay

## 2019-12-07 NOTE — Telephone Encounter (Signed)
Spoke with pt to confirm her appointment with pulmonary rehab on 12/07/19 at 2:15. Pt stated that she will not be able to make it to that appointment due to being out of town. She will call back when she gets back in town to schedule a new appointment with pulmonary rehab.

## 2019-12-08 ENCOUNTER — Ambulatory Visit (HOSPITAL_COMMUNITY): Payer: BC Managed Care – PPO

## 2019-12-21 DIAGNOSIS — H9201 Otalgia, right ear: Secondary | ICD-10-CM | POA: Diagnosis not present

## 2019-12-21 DIAGNOSIS — H6090 Unspecified otitis externa, unspecified ear: Secondary | ICD-10-CM | POA: Diagnosis not present

## 2019-12-23 DIAGNOSIS — B029 Zoster without complications: Secondary | ICD-10-CM | POA: Diagnosis not present

## 2019-12-27 ENCOUNTER — Telehealth (HOSPITAL_COMMUNITY): Payer: Self-pay

## 2019-12-27 NOTE — Telephone Encounter (Signed)
Per pulmonary staff, closed pulmonary rehab referral.

## 2020-01-02 DIAGNOSIS — H8111 Benign paroxysmal vertigo, right ear: Secondary | ICD-10-CM | POA: Diagnosis not present

## 2020-01-02 DIAGNOSIS — B028 Zoster with other complications: Secondary | ICD-10-CM | POA: Diagnosis not present

## 2020-01-13 ENCOUNTER — Encounter: Payer: Self-pay | Admitting: Physical Therapy

## 2020-01-13 ENCOUNTER — Other Ambulatory Visit: Payer: Self-pay

## 2020-01-13 ENCOUNTER — Ambulatory Visit: Payer: BC Managed Care – PPO | Attending: Family Medicine | Admitting: Physical Therapy

## 2020-01-13 DIAGNOSIS — R2681 Unsteadiness on feet: Secondary | ICD-10-CM | POA: Diagnosis not present

## 2020-01-13 DIAGNOSIS — R42 Dizziness and giddiness: Secondary | ICD-10-CM | POA: Diagnosis not present

## 2020-01-13 NOTE — Therapy (Signed)
[facility] [address] [place], [place], [address] Phone: [phone]   Fax:  [phone]  Physical Therapy Evaluation  Patient Details  Name: Lauren Peters MRN: [id number] Date of Birth: 1955-12-31 Referring Provider (PT): Kathyrn Lass, MD   Encounter Date: 01/13/2020   PT End of Session - 01/13/20 1209    Visit Number 1    Number of Visits 7    Date for PT Re-Evaluation 02/27/20    Authorization Type BCBS?    PT Start Time 1100    PT Stop Time 1145    PT Time Calculation (min) 45 min    Activity Tolerance Patient tolerated treatment well    Behavior During Therapy WFL for tasks assessed/performed           Past Medical History:  Diagnosis Date  . Acid reflux   . Anxiety   . Depression   . History of kidney stones     Past Surgical History:  Procedure Laterality Date  . ABDOMINAL HYSTERECTOMY     over 30 years ago  . EXTRACORPOREAL SHOCK WAVE LITHOTRIPSY Left 08/23/2018   Procedure: EXTRACORPOREAL SHOCK WAVE LITHOTRIPSY (ESWL);  Surgeon: Franchot Gallo, MD;  Location: [facility];  Service: Urology;  Laterality: Left;  . HAND SURGERY Right    x2 on right hand, carpal tunnel, bone removed  . LITHOTRIPSY  2007  . REDUCTION MAMMAPLASTY     20 years ago    There were no vitals filed for this visit.    Subjective Assessment - 01/13/20 1107    Subjective Retired yesterday! Had an original referral to Neuro OP for dizziness related to COVID but never made it in.  Then in September she had shingles on R side of neck and vertigo.  Shingles has resolved and dizziness has resolved.  This was the first time she has had vertigo.    Pertinent History GERD, anxiety, depression, COVID 19 - d/c on 2L 02, PNA, DOE, Vit D deficiency, tobacco use    Patient Stated Goals To find out if she still has Vertigo; address post-covid fatigue    Currently in Pain? Yes    Pain Location Neck    Pain Orientation Right    Pain  Descriptors / Indicators Tender    Pain Type Neuropathic pain              [facility] PT Assessment - 01/13/20 1113      Assessment   Medical Diagnosis Vertigo    Referring Provider (PT) Kathyrn Lass, MD    Onset Date/Surgical Date 01/09/20      Precautions   Precautions Other (comment)    Precaution Comments GERD, anxiety, depression, COVID 42 - d/c on 2L 02, PNA, DOE, Vit D deficiency, tobacco use       Balance Screen   Has the patient fallen in the past 6 months No    Has the patient had a decrease in activity level because of a fear of falling?  Yes      [place] residence    Living Arrangements Other relatives   sister   Type of [place] to enter    Entrance Stairs-Number of Steps 2-3    [place];Able to live on main level with bedroom/bathroom    Alternate Level Stairs-Number of Steps 15    Additional Comments notices most fatigue and SOB with stair negotiation.  Is back to  driving.        Prior Function   Level of Independence Independent    Vocation Retired    [employer] was working from home; retired Administrator, [employer]    Leisure outdoor activities, walking, kayaking      Observation/Other Assessments   Focus on Therapeutic Outcomes (FOTO)  79%      Sensation   Light Touch Appears Intact      ROM / Strength   AROM / PROM / Strength Strength      Strength   Overall Strength Within functional limits for tasks performed      Ambulation/Gait   Ambulation/Gait Yes    Ambulation/Gait Assistance 7: Independent    Ambulation Distance (Feet) 115 Feet    Assistive device None    Gait Pattern Within Functional Limits    Ambulation Surface Level;Indoor      Functional Gait  Assessment   Gait assessed  Yes    Gait Level Surface Walks 20 ft in less than 5.5 sec, no assistive devices, good speed, no evidence for imbalance, normal gait pattern, deviates no more than 6 in outside of the 12 in  walkway width.    Change in Gait Speed Able to smoothly change walking speed without loss of balance or gait deviation. Deviate no more than 6 in outside of the 12 in walkway width.    Gait with Horizontal Head Turns Performs head turns smoothly with slight change in gait velocity (eg, minor disruption to smooth gait path), deviates 6-10 in outside 12 in walkway width, or uses an assistive device.    Gait with Vertical Head Turns Performs head turns with no change in gait. Deviates no more than 6 in outside 12 in walkway width.    Gait and Pivot Turn Pivot turns safely within 3 sec and stops quickly with no loss of balance.    Step Over Obstacle Is able to step over 2 stacked shoe boxes taped together (9 in total height) without changing gait speed. No evidence of imbalance.    Gait with Narrow Base of Support Is able to ambulate for 10 steps heel to toe with no staggering.    Gait with Eyes Closed Walks 20 ft, slow speed, abnormal gait pattern, evidence for imbalance, deviates 10-15 in outside 12 in walkway width. Requires more than 9 sec to ambulate 20 ft.    Ambulating Backwards Walks 20 ft, uses assistive device, slower speed, mild gait deviations, deviates 6-10 in outside 12 in walkway width.    Steps Alternating feet, no rail.    Total Score 26    FGA comment: 26/30                  Vestibular Assessment - 01/13/20 1117      Symptom Behavior   Subjective history of current problem Pt reports changes in vision but no diplopia, went to see eye doctor recently, no changes in hearing, denies aural fullness or tinnitus.  No further HA or neck pain, some sensitivity.      Type of Dizziness  Spinning    Frequency of Dizziness intermittent    Duration of Dizziness would wake her up, seconds; 3-4 days    Symptom Nature Spontaneous    Aggravating Factors Spontaneous onset    Relieving Factors Head stationary    Progression of Symptoms Better      Oculomotor Exam   Oculomotor Alignment  Normal    Ocular ROM WFL    Spontaneous Absent  Gaze-induced  Absent    Smooth Pursuits Intact    Saccades Intact      Oculomotor Exam-Fixation Suppressed    Left Head Impulse negative    Right Head Impulse positive      Vestibulo-Ocular Reflex   VOR to Slow Head Movement Normal    VOR Cancellation Normal      Visual Acuity   Static not able to perform due to contacts, one for near, one for far      Positional Testing   Dix-Hallpike Dix-Hallpike Right;Dix-Hallpike Left    Horizontal Canal Testing Horizontal Canal Right;Horizontal Canal Left      Dix-Hallpike Right   Dix-Hallpike Right Duration 0    Dix-Hallpike Right Symptoms No nystagmus      Dix-Hallpike Left   Dix-Hallpike Left Duration 0    Dix-Hallpike Left Symptoms No nystagmus      Horizontal Canal Right   Horizontal Canal Right Duration 0    Horizontal Canal Right Symptoms Normal      Horizontal Canal Left   Horizontal Canal Left Duration 0    Horizontal Canal Left Symptoms Normal      Positional Sensitivities   Sit to Supine No dizziness    Supine to Left Side No dizziness    Supine to Right Side No dizziness    Supine to Sitting No dizziness    Right Hallpike No dizziness    Up from Right Hallpike No dizziness    Up from Left Hallpike No dizziness    Nose to Right Knee No dizziness    Right Knee to Sitting No dizziness    Nose to Left Knee No dizziness    Left Knee to Sitting No dizziness    Head Turning x 5 No dizziness    Head Nodding x 5 No dizziness    Pivot Right in Standing Lightheadedness    Pivot Left in Standing Lightheadedness    Rolling Right No dizziness    Rolling Left No dizziness              Objective measurements completed on examination: See above findings.               PT Education - 01/13/20 1209    Education Details clinical findings, PT POC and goals    Person(s) Educated Patient    Methods Explanation    Comprehension Verbalized understanding                 PT Long Term Goals - 01/13/20 1223      PT LONG TERM GOAL #1   Title Pt will demonstrate independence with final HEP    Time 6    Period Weeks    Status New    Target Date 02/27/20      PT LONG TERM GOAL #2   Title Pt will report overall increase in function on FOTO to >/= 92%    Baseline 79%    Time 6    Period Weeks    Status New    Target Date 02/27/20      PT LONG TERM GOAL #3   Title Pt will increase FGA score by 3 points and pt will report no dizziness with pivoting to L and R during gait to indicate decreased falls risk    Baseline 26/30    Time 6    Period Weeks    Status New    Target Date 02/27/20      PT LONG TERM GOAL #4  Title Pt will report a return to outdoor activities including kayaking with minimal symptoms of dizziness or fatigue    Time 6    Period Weeks    Status New    Target Date 02/27/20      PT LONG TERM GOAL #5   Title 6 min walk test or 2 min step test goal as indicated    Time 6    Period Weeks    Status New    Target Date 02/27/20                  Plan - 01/13/20 1213    Clinical Impression Statement Pt is a 64 year old female referred to Neuro OPPT for evaluation of vertigo and post-Covid symptoms.  Pt's PMH is significant for the following: GERD, anxiety, depression, COVID 19 - d/c on 2L 02, PNA, DOE, Vit D deficiency, tobacco use. The following deficits were noted during pt's exam: impaired VOR as indicated by HIT, mild disequilibrium and motion sensitivity, impaired balance and impaired activity tolerance/endurance.  Pt's FGA score indicates pt is at slightly increased risk for falls. Pt would benefit from skilled PT to address these impairments and functional limitations to maximize functional mobility independence and reduce falls risk as pt is very active outdoors.    Personal Factors and Comorbidities Comorbidity 3+;Past/Current Experience    Comorbidities GERD, anxiety, depression, COVID 19 - d/c on 2L 02, PNA,  DOE, Vit D deficiency, tobacco use    Examination-Activity Limitations Stairs;Locomotion Level    Examination-Participation Restrictions Community Activity    Stability/Clinical Decision Making Stable/Uncomplicated    Clinical Decision Making Low    Rehab Potential Good    PT Frequency 1x / week    PT Duration 6 weeks    PT Treatment/Interventions ADLs/Self Care Home Management;Canalith Repostioning;Stair training;Functional mobility training;Therapeutic activities;Therapeutic exercise;Balance training;Neuromuscular re-education;Patient/family education;Vestibular    PT Next Visit Plan teach VOR x1 viewing.  6 minute walk test or 2 minute step test for exertional testing.  High level balance on compliant surfaces, vision removed.    Consulted and Agree with Plan of Care Patient           Patient will benefit from skilled therapeutic intervention in order to improve the following deficits and impairments:  Decreased activity tolerance, Decreased balance, Dizziness  Visit Diagnosis: Dizziness and giddiness  Unsteadiness on feet     Problem List Patient Active Problem List   Diagnosis Date Noted  . Pressure injury of skin 04/25/2019  . Sepsis due to COVID-19 ([place]) 04/24/2019  . Hypoxia 04/24/2019  . Thrombocytopenia ([place]) 04/24/2019  . Anxiety and depression 04/24/2019  . Pneumonia due to COVID-19 virus 04/23/2019    Rico Junker, PT, DPT 01/13/20    12:28 PM    [facility] [address] [place], [place], [address] Phone: [phone]   Fax:  [phone]  Name: ATHENE SCHUHMACHER MRN: [id number] Date of Birth: 1955-04-16

## 2020-01-16 ENCOUNTER — Ambulatory Visit: Payer: BC Managed Care – PPO | Admitting: Physical Therapy

## 2020-01-23 ENCOUNTER — Ambulatory Visit: Payer: BC Managed Care – PPO | Admitting: Physical Therapy

## 2020-01-24 ENCOUNTER — Encounter: Payer: Self-pay | Admitting: Physical Therapy

## 2020-01-24 NOTE — Therapy (Signed)
[facility] [address] [place], [place], [address] Phone: [phone]   Fax:  [phone]  Patient Details  Name: Lauren Peters MRN: [id number] Date of Birth: 07/14/1955 Referring Provider:  No ref. provider found  Encounter Date: 01/24/2020  PHYSICAL THERAPY DISCHARGE SUMMARY  Visits from Start of Care: 1  Current functional level related to goals / functional outcomes: Unable to fully assess; pt contacted clinic to cancel all follow up appointments.  She doesn't think she needs vestibular rehab at this time.    Remaining deficits: Deficits noted on evaluation   Education / Equipment: N/A  Plan: Patient agrees to discharge.  Patient goals were not met. Patient is being discharged due to the patient's request.  ?????     Rico Junker, PT, DPT 01/24/20    5:51 PM    [facility] [address] [place], [place], [address] Phone: [phone]   Fax:  [phone]

## 2020-02-01 DIAGNOSIS — E559 Vitamin D deficiency, unspecified: Secondary | ICD-10-CM | POA: Diagnosis not present

## 2020-02-03 ENCOUNTER — Ambulatory Visit: Payer: BC Managed Care – PPO | Admitting: Physical Therapy

## 2020-02-07 ENCOUNTER — Ambulatory Visit: Payer: BC Managed Care – PPO | Admitting: Physical Therapy

## 2020-02-16 ENCOUNTER — Ambulatory Visit: Payer: BC Managed Care – PPO | Admitting: Physical Therapy

## 2020-02-21 ENCOUNTER — Ambulatory Visit: Payer: BC Managed Care – PPO | Admitting: Physical Therapy

## 2020-05-14 IMAGING — DX DG CHEST 1V PORT
1 series · 1 of 1 positions shown · non-contrast
Comparison: April 08, 2016

CLINICAL DATA: Shortness of breath with [id number].

EXAM:
PORTABLE CHEST 1 VIEW

[chest ap]
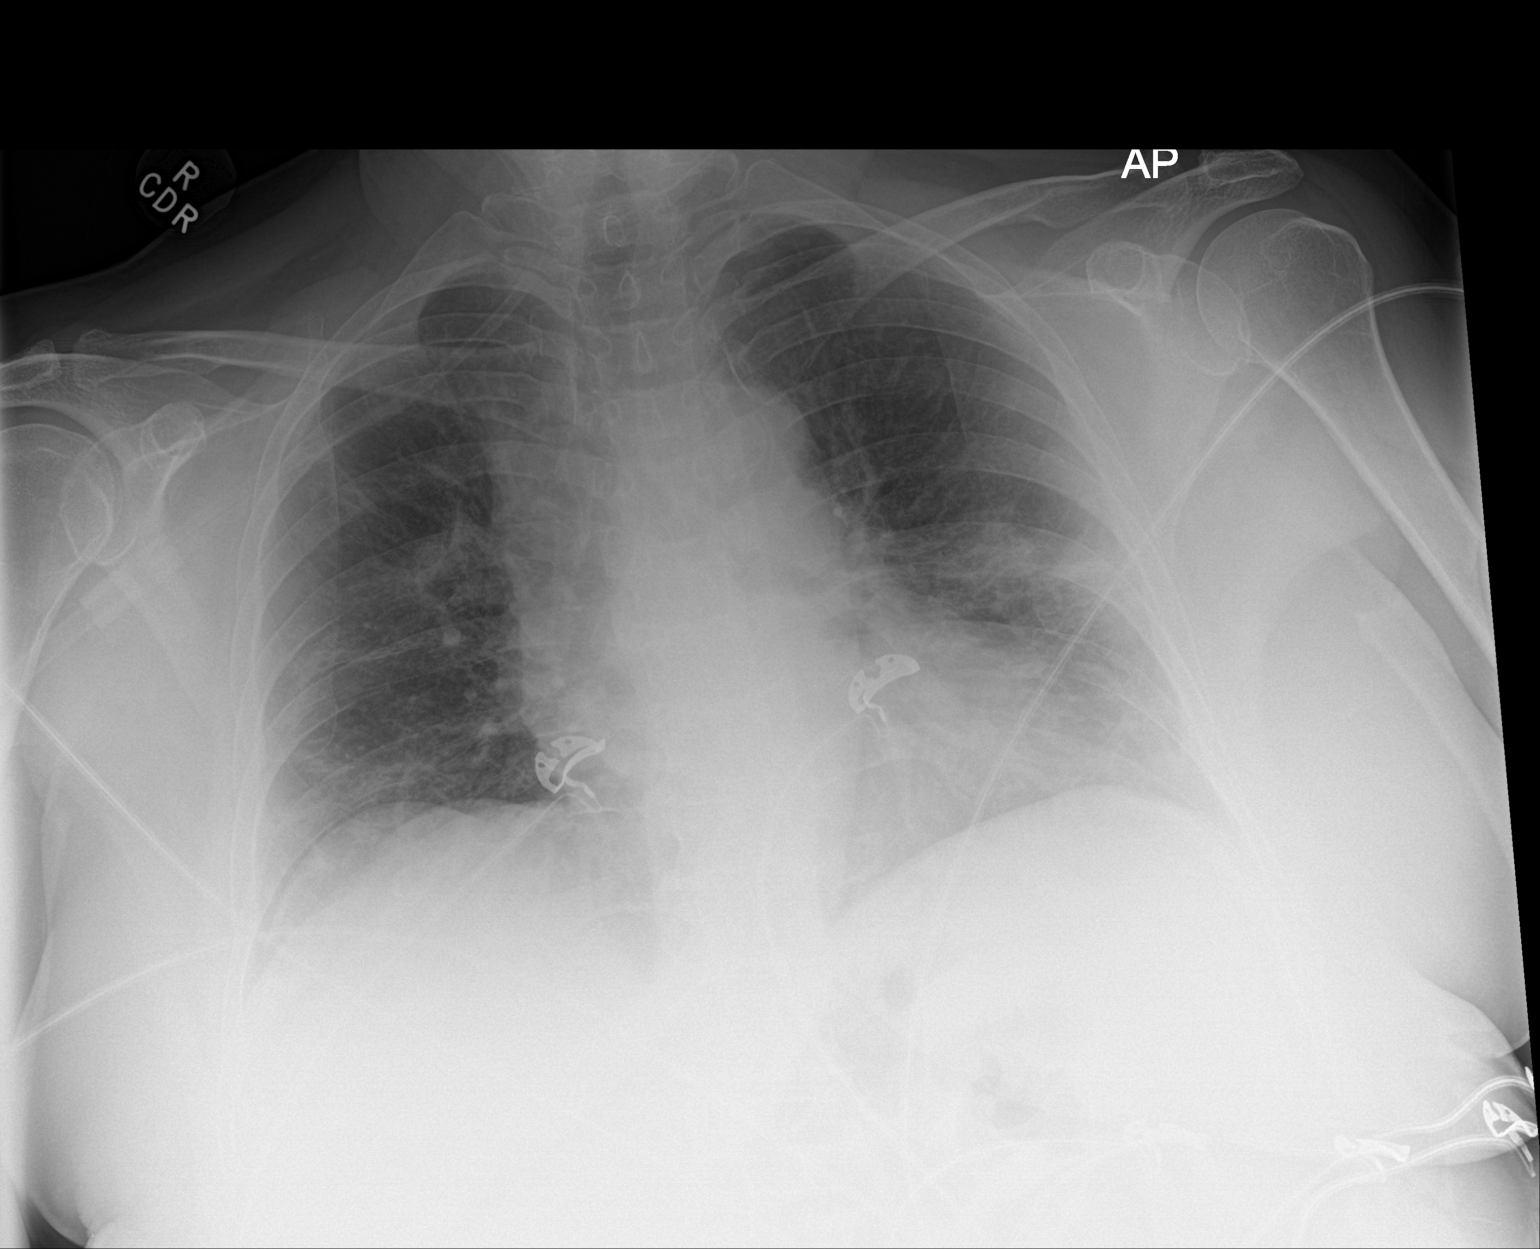

[1 of 1 positions shown; findings below may reference images not displayed]

FINDINGS: There is scattered airspace opacities bilaterally. The heart size is
borderline enlarged. There is no pneumothorax. No large pleural
effusion. There is no acute osseous abnormality.
IMPRESSION: Scattered bilateral airspace opacities consistent with the patient's
history of viral pneumonia.

## 2020-05-22 DIAGNOSIS — N764 Abscess of vulva: Secondary | ICD-10-CM | POA: Diagnosis not present

## 2020-08-14 DIAGNOSIS — Z6827 Body mass index (BMI) 27.0-27.9, adult: Secondary | ICD-10-CM | POA: Diagnosis not present

## 2020-08-14 DIAGNOSIS — L292 Pruritus vulvae: Secondary | ICD-10-CM | POA: Diagnosis not present

## 2020-08-14 DIAGNOSIS — Z1231 Encounter for screening mammogram for malignant neoplasm of breast: Secondary | ICD-10-CM | POA: Diagnosis not present

## 2020-08-14 DIAGNOSIS — M899 Disorder of bone, unspecified: Secondary | ICD-10-CM | POA: Diagnosis not present

## 2020-08-14 DIAGNOSIS — Z01419 Encounter for gynecological examination (general) (routine) without abnormal findings: Secondary | ICD-10-CM | POA: Diagnosis not present

## 2020-08-14 DIAGNOSIS — N3945 Continuous leakage: Secondary | ICD-10-CM | POA: Diagnosis not present

## 2020-08-15 ENCOUNTER — Other Ambulatory Visit: Payer: Self-pay | Admitting: Obstetrics and Gynecology

## 2020-08-15 DIAGNOSIS — R928 Other abnormal and inconclusive findings on diagnostic imaging of breast: Secondary | ICD-10-CM

## 2020-08-30 ENCOUNTER — Ambulatory Visit
Admission: RE | Admit: 2020-08-30 | Discharge: 2020-08-30 | Disposition: A | Discharge status: Home / Self Care | Payer: BC Managed Care – PPO | Source: Ambulatory Visit | Attending: Obstetrics and Gynecology | Admitting: Obstetrics and Gynecology

## 2020-08-30 ENCOUNTER — Other Ambulatory Visit: Payer: Self-pay

## 2020-08-30 ENCOUNTER — Ambulatory Visit: Payer: BC Managed Care – PPO

## 2020-08-30 DIAGNOSIS — R928 Other abnormal and inconclusive findings on diagnostic imaging of breast: Secondary | ICD-10-CM

## 2020-08-30 DIAGNOSIS — R922 Inconclusive mammogram: Secondary | ICD-10-CM | POA: Diagnosis not present

## 2020-10-31 ENCOUNTER — Ambulatory Visit: Payer: Medicare HMO | Admitting: Physical Therapy

## 2021-01-03 DIAGNOSIS — L089 Local infection of the skin and subcutaneous tissue, unspecified: Secondary | ICD-10-CM | POA: Diagnosis not present

## 2021-01-08 DIAGNOSIS — L03011 Cellulitis of right finger: Secondary | ICD-10-CM | POA: Diagnosis not present

## 2021-01-08 DIAGNOSIS — Z6830 Body mass index (BMI) 30.0-30.9, adult: Secondary | ICD-10-CM | POA: Diagnosis not present

## 2021-01-10 DIAGNOSIS — L03011 Cellulitis of right finger: Secondary | ICD-10-CM | POA: Diagnosis not present

## 2021-01-10 DIAGNOSIS — Z6830 Body mass index (BMI) 30.0-30.9, adult: Secondary | ICD-10-CM | POA: Diagnosis not present

## 2021-02-12 DIAGNOSIS — K219 Gastro-esophageal reflux disease without esophagitis: Secondary | ICD-10-CM | POA: Diagnosis not present

## 2021-02-12 DIAGNOSIS — R69 Illness, unspecified: Secondary | ICD-10-CM | POA: Diagnosis not present

## 2021-02-12 DIAGNOSIS — Z23 Encounter for immunization: Secondary | ICD-10-CM | POA: Diagnosis not present

## 2021-02-12 DIAGNOSIS — E669 Obesity, unspecified: Secondary | ICD-10-CM | POA: Diagnosis not present

## 2021-02-12 DIAGNOSIS — R053 Chronic cough: Secondary | ICD-10-CM | POA: Diagnosis not present

## 2021-03-18 ENCOUNTER — Other Ambulatory Visit: Payer: Self-pay | Admitting: Family Medicine

## 2021-03-18 ENCOUNTER — Ambulatory Visit
Admission: RE | Admit: 2021-03-18 | Discharge: 2021-03-18 | Disposition: A | Discharge status: Home / Self Care | Payer: Medicare HMO | Source: Ambulatory Visit | Attending: Family Medicine | Admitting: Family Medicine

## 2021-03-18 ENCOUNTER — Other Ambulatory Visit: Payer: Self-pay

## 2021-03-18 DIAGNOSIS — I700 Atherosclerosis of aorta: Secondary | ICD-10-CM | POA: Diagnosis not present

## 2021-03-18 DIAGNOSIS — R059 Cough, unspecified: Secondary | ICD-10-CM

## 2021-03-18 DIAGNOSIS — J984 Other disorders of lung: Secondary | ICD-10-CM | POA: Diagnosis not present

## 2021-03-21 DIAGNOSIS — I700 Atherosclerosis of aorta: Secondary | ICD-10-CM | POA: Diagnosis not present

## 2021-03-21 DIAGNOSIS — R059 Cough, unspecified: Secondary | ICD-10-CM | POA: Diagnosis not present

## 2021-03-26 DIAGNOSIS — Z008 Encounter for other general examination: Secondary | ICD-10-CM | POA: Diagnosis not present

## 2021-03-26 DIAGNOSIS — K219 Gastro-esophageal reflux disease without esophagitis: Secondary | ICD-10-CM | POA: Diagnosis not present

## 2021-03-26 DIAGNOSIS — Z882 Allergy status to sulfonamides status: Secondary | ICD-10-CM | POA: Diagnosis not present

## 2021-03-26 DIAGNOSIS — Z803 Family history of malignant neoplasm of breast: Secondary | ICD-10-CM | POA: Diagnosis not present

## 2021-03-26 DIAGNOSIS — Z7722 Contact with and (suspected) exposure to environmental tobacco smoke (acute) (chronic): Secondary | ICD-10-CM | POA: Diagnosis not present

## 2021-03-26 DIAGNOSIS — E669 Obesity, unspecified: Secondary | ICD-10-CM | POA: Diagnosis not present

## 2021-03-26 DIAGNOSIS — Z6830 Body mass index (BMI) 30.0-30.9, adult: Secondary | ICD-10-CM | POA: Diagnosis not present

## 2021-03-26 DIAGNOSIS — Z87891 Personal history of nicotine dependence: Secondary | ICD-10-CM | POA: Diagnosis not present

## 2021-03-26 DIAGNOSIS — R030 Elevated blood-pressure reading, without diagnosis of hypertension: Secondary | ICD-10-CM | POA: Diagnosis not present

## 2021-03-26 DIAGNOSIS — R32 Unspecified urinary incontinence: Secondary | ICD-10-CM | POA: Diagnosis not present

## 2021-03-26 DIAGNOSIS — J309 Allergic rhinitis, unspecified: Secondary | ICD-10-CM | POA: Diagnosis not present

## 2021-03-26 DIAGNOSIS — R69 Illness, unspecified: Secondary | ICD-10-CM | POA: Diagnosis not present

## 2021-04-22 DIAGNOSIS — Z23 Encounter for immunization: Secondary | ICD-10-CM | POA: Diagnosis not present

## 2021-04-22 DIAGNOSIS — Z136 Encounter for screening for cardiovascular disorders: Secondary | ICD-10-CM | POA: Diagnosis not present

## 2021-04-22 DIAGNOSIS — Z79899 Other long term (current) drug therapy: Secondary | ICD-10-CM | POA: Diagnosis not present

## 2021-04-22 DIAGNOSIS — I700 Atherosclerosis of aorta: Secondary | ICD-10-CM | POA: Diagnosis not present

## 2021-04-22 DIAGNOSIS — R053 Chronic cough: Secondary | ICD-10-CM | POA: Diagnosis not present

## 2021-04-22 DIAGNOSIS — Z0000 Encounter for general adult medical examination without abnormal findings: Secondary | ICD-10-CM | POA: Diagnosis not present

## 2021-04-22 DIAGNOSIS — E669 Obesity, unspecified: Secondary | ICD-10-CM | POA: Diagnosis not present

## 2021-05-17 NOTE — Progress Notes (Signed)
Cardiology Office Note:    Date:  05/20/2021   ID:  Lauren, Peters [birth year], MRN [id number]  PCP:  Lauren Lass, MD  Cardiologist:  Buford Dresser, MD  Referring MD: Lauren Lass, MD   CC: new patient evaluation for shortness of breath.   History of Present Illness:    Lauren Peters is a 66 y.o. female with a hx of acid reflux, nephrolithiasis, and depression, who is seen as a new consult at the request of Lauren Lass, MD for the evaluation and management of persistent shortness of breath post COVID.  Note from Dr. Sabra Peters dated 05/01/21 reviewed. Patient noted persistent shortness of breath post Covid, referred to cardiology for further evaluation. Initial Covid infestion 04/2019, second infection 09/2020, with persistent cough and dyspnea on exertion. Note from Lauren Peters from 03/21/21 noted history of aortic atherosclerosis and recommended consideration of a statin.   Saw Dr. Vaughan Peters once after her Covid hospitalization. CTA 06/28/2019-no pulmonary embolism, peripheral interstitial thickening, mild groundglass in alternate pattern.  Cardiovascular risk factors: Prior clinical ASCVD: None Comorbid conditions: Hypertension - 20 years ago she was started on antihypertensives (discontinued 2 months later due to cough and low blood pressures). Since her antihypertensives were discontinued she did not begin any subsequent therapy, and her high blood pressure was thought to be due to white coat syndrome. Metabolic syndrome/Obesity: Highest adult weight is about 164 lbs, today's weight is 159 lbs. Chronic inflammatory conditions: Tobacco use history: Former smoker, quit about 40 years ago. Alcohol: Limited to weekends. 3-4 drinks at most. Family history: Her grandparents both had strokes. Her mother had a heart attack about 5 years ago, and is a patient of Dr. Oval Peters. Her father had Parkinson's.  Prior cardiac testing and/or incidental findings on other testing (ie coronary calcium):  Aortic atherosclerosis noted on prior CT scan. Exercise level: Retired 1 year ago. Fairly active, typically she will walk quickly. She enjoys kayaking. She will become winded with climbing stairs. However, she denies ever needing to completely stop her activities/exercise due to her shortness of breath. Current diet: Enjoys vegetables, avoids fruits. Steak, burgers, salads. Has chicken more often than beef, and salmon 1-2 times a month.   She denies any cardiovascular issues prior to her initial COVID infection about 3 years ago. She developed a second COVID infection 09/2020. She was discharged on oxygen. Since then her breathing has never returned to baseline, but has still improved.   Lately, her shortness of breath is not as bothersome as her intermittent dry cough. She wakes up several times a night with severe cough. Her cough may also occur randomly at any time of day. Usually she feels an irritation in her throat that forces her to cough.  Additionally, she suffers from nausea in the mornings. This does not occur every morning but is bothersome. After she wakes up and moves a bit her nausea will resolve. Her nausea began with her first COVID infection.  If she stands for prolonged periods, she will develop RLE numbness in her thigh. She denies any weakness.  She denies any palpitations, or chest pain. No lightheadedness, headaches, syncope, orthopnea, PND, or lower extremity edema.  Past Medical History:  Diagnosis Date   Acid reflux    Anxiety    Depression    History of kidney stones     Past Surgical History:  Procedure Laterality Date   ABDOMINAL HYSTERECTOMY     over 30 years ago   [place] LITHOTRIPSY Left 08/23/2018  Procedure: EXTRACORPOREAL SHOCK WAVE LITHOTRIPSY (ESWL);  Surgeon: Lauren Gallo, MD;  Location: [facility];  Service: Urology;  Laterality: Left;   HAND SURGERY Right    x2 on right hand, carpal tunnel, bone removed   LITHOTRIPSY  2007    REDUCTION MAMMAPLASTY     20 years ago    Current Medications: Current Outpatient Medications on File Prior to Visit  Medication Sig   ascorbic acid (VITAMIN C) 500 MG tablet Take 1 tablet (500 mg total) by mouth daily.   buPROPion (WELLBUTRIN) 75 MG tablet Take 75 mg by mouth 2 (two) times daily.    chlorpheniramine-HYDROcodone (TUSSIONEX) 10-8 MG/5ML SUER Take 5 mLs by mouth every 12 (twelve) hours as needed for cough.   montelukast (SINGULAIR) 10 MG tablet Take 10 mg by mouth daily.   omeprazole (PRILOSEC) 40 MG capsule Take 40 mg by mouth daily.    acetaminophen (TYLENOL) 325 MG tablet Take 325 mg by mouth every 6 (six) hours as needed for mild pain or headache. (Patient not taking: Reported on 05/20/2021)   benzonatate (TESSALON) 100 MG capsule Take 100 mg by mouth 3 (three) times daily. (Patient not taking: Reported on 05/20/2021)   dexamethasone (DECADRON) 6 MG tablet Take 1 tablet (6 mg total) by mouth daily. (Patient not taking: Reported on 05/20/2021)   zinc sulfate 220 (50 Zn) MG capsule Take 1 capsule (220 mg total) by mouth daily. (Patient not taking: Reported on 05/20/2021)   No current facility-administered medications on file prior to visit.     Allergies:   Albuterol and Sulfamethoxazole   Social History   Tobacco Use   Smoking status: Never   Smokeless tobacco: Never  Vaping Use   Vaping Use: Never used  Substance Use Topics   Alcohol use: Yes    Comment: 2x week   Drug use: Never    Family History: family history includes Breast cancer in an other family member.  ROS:   Please see the history of present illness.  Additional pertinent ROS: Constitutional: Negative for chills, fever, night sweats, unintentional weight loss  HENT: Negative for ear pain and hearing loss.   Eyes: Negative for loss of vision and eye pain.  Respiratory: Negative for sputum, wheezing. Positive for shortness of breath, cough.   Cardiovascular: See HPI. Gastrointestinal: Negative for  abdominal pain, melena, and hematochezia. Positive for nausea.  Genitourinary: Negative for dysuria and hematuria.  Musculoskeletal: Negative for falls and myalgias.  Skin: Negative for itching and rash.  Neurological: Negative for focal weakness, and loss of consciousness. Positive for RLE numbness. Endo/Heme/Allergies: Does not bruise/bleed easily.     EKGs/Labs/Other Studies Reviewed:    The following studies were reviewed today:  CTA Chest 06/28/2019: FINDINGS: Cardiovascular: No filling defects within the pulmonary arteries arteries to suggest acute pulmonary embolism.   Mediastinum/Nodes: No axillary or supraclavicular adenopathy. No mediastinal hilar adenopathy. No pericardial effusion. Esophagus normal.   Lungs/Pleura: Mild peripheral linear interstitial thickening. Mild peripheral ground-glass opacities in the LEFT upper lobe and LEFT lower lobe. No focal consolidation. No discrete airspace disease. Airways normal. No pulmonary infarction.   Upper Abdomen: Limited view of the liver, kidneys, pancreas are unremarkable. Normal adrenal glands.   Musculoskeletal: No aggressive osseous lesion.   Review of the MIP images confirms the above findings.   IMPRESSION: 1. Peripheral interstitial thickening and mild ground-glass opacities in lungs are sequelae of resolving COVID infection. No focal consolidation. 2. No evidence acute pulmonary embolism.  RLE Venous Doppler 07/17/2016:  Summary:   -  No evidence of deep vein thrombosis involving the visualized    veins of the right lower extremity.  - No evidence of Baker&'s cyst in the popliteal fossa although there    does appear to be a small pocket of fluid near the gastronemius    muscle in the proximal calf, possible ruptured cyst, 4.4 x 3.9cm?   EKG:  EKG is personally reviewed.   05/20/2021: NSR at 87 bpm, IVCB  Recent Labs: No results found for requested labs within last 8760 hours.   Recent Lipid Panel     Component Value Date/Time   TRIG 57 04/23/2019 2219    Physical Exam:    VS:  BP (!) 144/90 (BP Location: Right Arm, Patient Position: Sitting, Cuff Size: Normal)    Pulse 87    Ht 5' (1.524 m)    Wt 159 lb 14.4 oz (72.5 kg)    SpO2 96%    BMI 31.23 kg/m     Wt Readings from Last 3 Encounters:  05/20/21 159 lb 14.4 oz (72.5 kg)  08/24/19 163 lb (73.9 kg)  04/23/19 155 lb (70.3 kg)    GEN: Well nourished, well developed in no acute distress HEENT: Normal, moist mucous membranes NECK: No JVD CARDIAC: regular rhythm, normal S1 and S2, no rubs or gallops. No murmur. VASCULAR: Radial and DP pulses 2+ bilaterally. No carotid bruits RESPIRATORY:  Clear to auscultation without rales, wheezing or rhonchi  ABDOMEN: Soft, non-tender, non-distended MUSCULOSKELETAL:  Ambulates independently SKIN: Warm and dry, no edema NEUROLOGIC:  Alert and oriented x 3. No focal neuro deficits noted. PSYCHIATRIC:  Normal affect    ASSESSMENT:    1. Dyspnea on exertion   2. Personal history of COVID-19   3. Aortic atherosclerosis (HCC)   4. Cardiac risk counseling   5. Counseling on health promotion and disease prevention   6. Pure hypercholesterolemia   7. Medication management    PLAN:    Dyspnea on exertion History of Covid-19 -ordered echocardiogram to evaluate for cardiac etiology -with persistent cough, SOB chronic and not limiting, if echo unremarkable would consider pulm workup vs. Stress testing  Aortic atherosclerosis Hypercholesterolemia -reviewed her images together from prior CT -discussed cholesterol at length -after discussion, she is amenable to statin and aspirin. Will start rousvastatin 10 mg daily, aspirin 81 mg daily. -per [facility] 04/22/21: Tchol 193, HDL 61, LDL 120, TG 67 -would aim for LDL <70 -recheck lipids/lfts in 3 mos  Cardiac risk counseling and prevention recommendations: -recommend heart healthy/Mediterranean diet, with whole grains, fruits, vegetable, fish, lean  meats, nuts, and olive oil. Limit salt. -recommend moderate walking, 3-5 times/week for 30-50 minutes each session. Aim for at least 150 minutes.week. Goal should be pace of 3 miles/hours, or walking 1.5 miles in 30 minutes -recommend avoidance of tobacco products. Avoid excess alcohol.   Plan for follow up: TBD based on results of Echo.  Buford Dresser, MD, PhD, [facility]    Medication Adjustments/Labs and Tests Ordered: Current medicines are reviewed at length with the patient today.  Concerns regarding medicines are outlined above.   Orders Placed This Encounter  Procedures   Lipid panel   Hepatic function panel   EKG 12-Lead   ECHOCARDIOGRAM COMPLETE   Meds ordered this encounter  Medications   rosuvastatin (CRESTOR) 10 MG tablet    Sig: Take 1 tablet (10 mg total) by mouth daily.    Dispense:  90 tablet    Refill:  3  aspirin EC 81 MG tablet    Sig: Take 1 tablet (81 mg total) by mouth daily. Swallow whole.    Dispense:  90 tablet    Refill:  3   Patient Instructions  Medication Instructions:  1) START: Aspirin 81 mg daily 2) START: Rosuvastatin 10 mg daily  *If you need a refill on your cardiac medications before your next appointment, please call your pharmacy*   Lab Work: Your provider has recommended lab work in May, 2023 (Fasting Lipid, LFT). Please have this collected at [facility] at [place]. The lab is open 8:00 am - 4:30 pm. Please avoid 12:00p - 1:00p for lunch hour. You do not need an appointment. Please go to [address] [place], [place] [address]. This is in the Primary Care office on the 3rd floor, let them know you are there for blood work and they will direct you to the lab.  If you have labs (blood work) drawn today and your tests are completely normal, you will receive your results only by: [place] (if you have MyChart) OR A paper copy in the mail If you have any lab test that is  abnormal or we need to change your treatment, we will call you to review the results.   Testing/Procedures: Your physician has requested that you have an echocardiogram. Echocardiography is a painless test that uses sound waves to create images of your heart. It provides your doctor with information about the size and shape of your heart and how well your hearts chambers and valves are working. This procedure takes approximately one hour. There are no restrictions for this procedure. [place], you and your health needs are our priority.  As part of our continuing mission to provide you with exceptional heart care, we have created designated Provider Care Teams.  These Care Teams include your primary Cardiologist (physician) and Advanced Practice Providers (APPs -  Physician Assistants and Nurse Practitioners) who all work together to provide you with the care you need, when you need it.  We recommend signing up for the patient portal called "MyChart".  Sign up information is provided on this After Visit Summary.  MyChart is used to connect with patients for Virtual Visits (Telemedicine).  Patients are able to view lab/test results, encounter notes, upcoming appointments, etc.  Non-urgent messages can be sent to your provider as well.   To learn more about what you can do with MyChart, go to [email or web address].    Your next appointment:   Based on test results  The format for your next appointment:   In Person  Provider:   Buford Dresser, MD        [facility],acting as a scribe for Buford Dresser, MD.,have documented all relevant documentation on the behalf of Buford Dresser, MD,as directed by  Buford Dresser, MD while in the presence of Buford Dresser, MD.  I, Buford Dresser, MD, have reviewed all documentation for this visit. The documentation on 05/20/21 for the exam,  diagnosis, procedures, and orders are all accurate and complete.   Signed, Buford Dresser, MD PhD 05/20/2021 12:46 PM    [place]

## 2021-05-20 ENCOUNTER — Ambulatory Visit (HOSPITAL_BASED_OUTPATIENT_CLINIC_OR_DEPARTMENT_OTHER): Payer: Medicare HMO | Admitting: Cardiology

## 2021-05-20 ENCOUNTER — Encounter (HOSPITAL_BASED_OUTPATIENT_CLINIC_OR_DEPARTMENT_OTHER): Payer: Self-pay | Admitting: Cardiology

## 2021-05-20 ENCOUNTER — Other Ambulatory Visit: Payer: Self-pay

## 2021-05-20 VITALS — BP 144/90 | HR 87 | Ht 60.0 in | Wt 159.9 lb

## 2021-05-20 DIAGNOSIS — E7800 Pure hypercholesterolemia, unspecified: Secondary | ICD-10-CM

## 2021-05-20 DIAGNOSIS — Z7189 Other specified counseling: Secondary | ICD-10-CM | POA: Diagnosis not present

## 2021-05-20 DIAGNOSIS — Z8616 Personal history of COVID-19: Secondary | ICD-10-CM | POA: Diagnosis not present

## 2021-05-20 DIAGNOSIS — Z79899 Other long term (current) drug therapy: Secondary | ICD-10-CM | POA: Diagnosis not present

## 2021-05-20 DIAGNOSIS — R0609 Other forms of dyspnea: Secondary | ICD-10-CM

## 2021-05-20 DIAGNOSIS — I700 Atherosclerosis of aorta: Secondary | ICD-10-CM

## 2021-05-20 MED ORDER — ROSUVASTATIN CALCIUM 10 MG PO TABS: 10.0000 mg | ORAL_TABLET | Freq: Every day | ORAL | 3 refills | Status: DC

## 2021-05-20 MED ORDER — ASPIRIN EC 81 MG PO TBEC: 81.0000 mg | DELAYED_RELEASE_TABLET | Freq: Every day | ORAL | 3 refills | Status: DC

## 2021-05-20 NOTE — Patient Instructions (Signed)
Medication Instructions:  1) START: Aspirin 81 mg daily 2) START: Rosuvastatin 10 mg daily  *If you need a refill on your cardiac medications before your next appointment, please call your pharmacy*   Lab Work: Your provider has recommended lab work in May, 2023 (Fasting Lipid, LFT). Please have this collected at [facility] at [place]. The lab is open 8:00 am - 4:30 pm. Please avoid 12:00p - 1:00p for lunch hour. You do not need an appointment. Please go to [address] [place], [place] [address]. This is in the Primary Care office on the 3rd floor, let them know you are there for blood work and they will direct you to the lab.  If you have labs (blood work) drawn today and your tests are completely normal, you will receive your results only by: [place] (if you have MyChart) OR A paper copy in the mail If you have any lab test that is abnormal or we need to change your treatment, we will call you to review the results.   Testing/Procedures: Your physician has requested that you have an echocardiogram. Echocardiography is a painless test that uses sound waves to create images of your heart. It provides your doctor with information about the size and shape of your heart and how well your hearts chambers and valves are working. This procedure takes approximately one hour. There are no restrictions for this procedure. [place], you and your health needs are our priority.  As part of our continuing mission to provide you with exceptional heart care, we have created designated Provider Care Teams.  These Care Teams include your primary Cardiologist (physician) and Advanced Practice Providers (APPs -  Physician Assistants and Nurse Practitioners) who all work together to provide you with the care you need, when you need it.  We recommend signing up for the patient portal called "MyChart".  Sign up  information is provided on this After Visit Summary.  MyChart is used to connect with patients for Virtual Visits (Telemedicine).  Patients are able to view lab/test results, encounter notes, upcoming appointments, etc.  Non-urgent messages can be sent to your provider as well.   To learn more about what you can do with MyChart, go to [email or web address].    Your next appointment:   Based on test results  The format for your next appointment:   In Person  Provider:   Buford Dresser, MD

## 2021-05-22 DIAGNOSIS — L570 Actinic keratosis: Secondary | ICD-10-CM | POA: Diagnosis not present

## 2021-05-22 DIAGNOSIS — D485 Neoplasm of uncertain behavior of skin: Secondary | ICD-10-CM | POA: Diagnosis not present

## 2021-05-22 DIAGNOSIS — L820 Inflamed seborrheic keratosis: Secondary | ICD-10-CM | POA: Diagnosis not present

## 2021-05-22 DIAGNOSIS — L814 Other melanin hyperpigmentation: Secondary | ICD-10-CM | POA: Diagnosis not present

## 2021-05-22 DIAGNOSIS — Z23 Encounter for immunization: Secondary | ICD-10-CM | POA: Diagnosis not present

## 2021-05-28 ENCOUNTER — Ambulatory Visit (INDEPENDENT_AMBULATORY_CARE_PROVIDER_SITE_OTHER): Payer: Medicare HMO

## 2021-05-28 ENCOUNTER — Other Ambulatory Visit: Payer: Self-pay

## 2021-05-28 DIAGNOSIS — R0609 Other forms of dyspnea: Secondary | ICD-10-CM | POA: Diagnosis not present

## 2021-05-28 LAB — ECHOCARDIOGRAM COMPLETE
AR max vel: 1.95 cm2
AV Area VTI: 2.18 cm2
AV Area mean vel: 2.02 cm2
AV Mean grad: 4 mmHg
AV Peak grad: 8.6 mmHg
Ao pk vel: 1.47 m/s
Calc EF: 40.6 %
S' Lateral: 3.65 cm
Single Plane A2C EF: 37 %
Single Plane A4C EF: 42.4 %

## 2021-06-03 ENCOUNTER — Encounter (HOSPITAL_BASED_OUTPATIENT_CLINIC_OR_DEPARTMENT_OTHER): Payer: Self-pay

## 2021-06-17 ENCOUNTER — Ambulatory Visit (INDEPENDENT_AMBULATORY_CARE_PROVIDER_SITE_OTHER): Payer: Medicare HMO | Admitting: Cardiology

## 2021-06-17 ENCOUNTER — Ambulatory Visit (HOSPITAL_BASED_OUTPATIENT_CLINIC_OR_DEPARTMENT_OTHER): Payer: Medicare HMO | Admitting: Family

## 2021-06-17 ENCOUNTER — Other Ambulatory Visit: Payer: Self-pay

## 2021-06-17 ENCOUNTER — Encounter (HOSPITAL_BASED_OUTPATIENT_CLINIC_OR_DEPARTMENT_OTHER): Payer: Self-pay | Admitting: Cardiology

## 2021-06-17 VITALS — BP 138/86 | HR 95 | Ht 60.0 in | Wt 159.2 lb

## 2021-06-17 DIAGNOSIS — R0609 Other forms of dyspnea: Secondary | ICD-10-CM | POA: Diagnosis not present

## 2021-06-17 DIAGNOSIS — E7800 Pure hypercholesterolemia, unspecified: Secondary | ICD-10-CM | POA: Diagnosis not present

## 2021-06-17 DIAGNOSIS — I447 Left bundle-branch block, unspecified: Secondary | ICD-10-CM | POA: Diagnosis not present

## 2021-06-17 DIAGNOSIS — I420 Dilated cardiomyopathy: Secondary | ICD-10-CM | POA: Insufficient documentation

## 2021-06-17 DIAGNOSIS — Z8616 Personal history of COVID-19: Secondary | ICD-10-CM | POA: Diagnosis not present

## 2021-06-17 DIAGNOSIS — Z01812 Encounter for preprocedural laboratory examination: Secondary | ICD-10-CM

## 2021-06-17 DIAGNOSIS — I700 Atherosclerosis of aorta: Secondary | ICD-10-CM

## 2021-06-17 DIAGNOSIS — Z79899 Other long term (current) drug therapy: Secondary | ICD-10-CM | POA: Diagnosis not present

## 2021-06-17 DIAGNOSIS — Z01818 Encounter for other preprocedural examination: Secondary | ICD-10-CM

## 2021-06-17 MED ORDER — ENTRESTO 24-26 MG PO TABS: 1.0000 | ORAL_TABLET | Freq: Two times a day (BID) | ORAL | 11 refills | Status: DC

## 2021-06-17 MED ORDER — METOPROLOL SUCCINATE ER 50 MG PO TB24: 50.0000 mg | ORAL_TABLET | Freq: Every day | ORAL | 3 refills | Status: DC

## 2021-06-17 NOTE — Patient Instructions (Addendum)
Medication Instructions:  ?1). START: Metoprolol Succinate 50 mg daily ?2). START: Entresto 24-26 mg twice a day -DON'T START until 07/05/21 (3 days after Cath) ? ?*If you need a refill on your cardiac medications before your next appointment, please call your pharmacy* ? ? ?Lab Work: ?Your provider has recommended lab work today (CBC, BMP, TSH). Please have this collected at [facility] at [place]. The lab is open 8:00 am - 4:30 pm. Please avoid 12:00p - 1:00p for lunch hour. You do not need an appointment. Please go to [address] [place], [place] [address]. This is in the Primary Care office on the 3rd floor, let them know you are there for blood work and they will direct you to the lab. ? ?If you have labs (blood work) drawn today and your tests are completely normal, you will receive your results only by: ?MyChart Message (if you have MyChart) OR ?A paper copy in the mail ?If you have any lab test that is abnormal or we need to change your treatment, we will call you to review the results. ? ? ?Testing/Procedures: ?Your physician has requested that you have a cardiac catheterization. Cardiac catheterization is used to diagnose and/or treat various heart conditions. Doctors may recommend this procedure for a number of different reasons. The most common reason is to evaluate chest pain. Chest pain can be a symptom of coronary artery disease (CAD), and cardiac catheterization can show whether plaque is narrowing or blocking your heart?s arteries. This procedure is also used to evaluate the valves, as well as measure the blood flow and oxygen levels in different parts of your heart. For further information please visit [email or web address]. Please follow instruction sheet, as given. ?[facility] ? ? ?Follow-Up: ?At [facility], you and your health needs are our priority.  As part of our continuing mission to provide you with exceptional heart care, we have created designated  Provider Care Teams.  These Care Teams include your primary Cardiologist (physician) and Advanced Practice Providers (APPs -  Physician Assistants and Nurse Practitioners) who all work together to provide you with the care you need, when you need it. ? ?We recommend signing up for the patient portal called "MyChart".  Sign up information is provided on this After Visit Summary.  MyChart is used to connect with patients for Virtual Visits (Telemedicine).  Patients are able to view lab/test results, encounter notes, upcoming appointments, etc.  Non-urgent messages can be sent to your provider as well.   ?To learn more about what you can do with MyChart, go to [email or web address].   ? ?Your next appointment:   ?1 week(s) after Cath procedure on 07/02/21 ? ?The format for your next appointment:   ?In Person ? ?Provider:   ?Buford Dresser, MD  ? ? ?[place] ?[place] ?[place] [id number] ?Dept: [phone] ? ?[place]  06/17/2021 ? ?You are scheduled for a Cardiac Catheterization on Tuesday, March 21 with Dr. Daneen Schick. ? ?1. Please arrive at the Main Entrance A at [facility]: [place], [place] [address] at 5:30 AM (This time is two hours before your procedure to ensure your preparation). Free valet parking service is available.  ? ?Special note: Every effort is made to have your procedure done on time. Please understand that emergencies sometimes delay scheduled procedures. ? ?2. Diet: Do not eat solid foods after midnight.  You may have clear liquids until  5 AM upon the day of the procedure. ? ?3. Labs: You will need to have blood drawn today (CBC, BMP, TSH) ? ?4. Medication instructions in preparation for your procedure: ? ? Contrast Allergy: No ? ? ?On the morning of your procedure, take Aspirin and any morning medicines NOT listed above.  You may use sips of water. ? ?5. Plan to go home the  same day, you will only stay overnight if medically necessary. ?6. You MUST have a responsible adult to drive you home. ?7. An adult MUST be with you the first 24 hours after you arrive home. ?8. Bring a current list of your medications, and the last time and date medication taken. ?9. Bring ID and current insurance cards. ?10.Please wear clothes that are easy to get on and off and wear slip-on shoes. ? ?Thank you for allowing [place] to care for you! ?  --  Invasive Cardiovascular services ? ? ?

## 2021-06-17 NOTE — H&P (View-Only) (Signed)
?Cardiology Office Note:   ? ?Date:  06/17/2021  ? ?ID:  Lauren Peters, DOB June 15, 1955, MRN [id number] ? ?PCP:  Kathyrn Lass, MD  ?Cardiologist:  Buford Dresser, MD ? ?Referring MD: Kathyrn Lass, MD  ? ?CC: Follow-up, Discuss Echo results  ? ?History of Present Illness:   ? ?Lauren Peters is a 66 y.o. female with a hx of acid reflux, nephrolithiasis, and depression, who is seen for follow-up. I initially met her 05/20/2021 as a new consult at the request of Kathyrn Lass, MD for the evaluation and management of persistent shortness of breath post COVID. ? ?Note from Dr. Sabra Heck dated 05/01/21 reviewed. Patient noted persistent shortness of breath post Covid, referred to cardiology for further evaluation. Initial Covid infestion 04/2019, second infection 09/2020, with persistent cough and dyspnea on exertion. Note from Jillyn Ledger from 03/21/21 noted history of aortic atherosclerosis and recommended consideration of a statin.  ? ?Saw Dr. Vaughan Browner once after her Covid hospitalization. CTA 06/28/2019-no pulmonary embolism, peripheral interstitial thickening, mild groundglass in alternate pattern. ? ?Cardiovascular risk factors: ?Prior clinical ASCVD: She denies any cardiovascular issues prior to her initial COVID infection about 3 years ago. She developed a second COVID infection 09/2020. She was discharged on oxygen. Since then her breathing has never returned to baseline, but has still improved.  ?Comorbid conditions: Hypertension - 20 years ago she was started on antihypertensives (discontinued 2 months later due to cough and low blood pressures). Since her antihypertensives were discontinued she did not begin any subsequent therapy, and her high blood pressure was thought to be due to white coat syndrome. ?Metabolic syndrome/Obesity: Highest adult weight is about 164 lbs, today's weight is 159 lbs. ?Chronic inflammatory conditions: ?Tobacco use history: Former smoker, quit about 40 years ago. ?Alcohol: Limited to weekends.  3-4 drinks at most. ?Family history: Her grandparents both had strokes. Her mother had a heart attack about 5 years ago, and is a patient of Dr. Oval Linsey. Her father had Parkinson's.  ?Prior cardiac testing and/or incidental findings on other testing (ie coronary calcium): Aortic atherosclerosis noted on prior CT scan. ?Exercise level: Retired 1 year ago. Fairly active, typically she will walk quickly. She enjoys kayaking. She will become winded with climbing stairs. However, she denies ever needing to completely stop her activities/exercise due to her shortness of breath. ?Current diet: Enjoys vegetables, avoids fruits. Steak, burgers, salads. Has chicken more often than beef, and salmon 1-2 times a month.  ? ?Today: ?She is accompanied by her daughter. Overall, she is feeling good with no new complaints. Her blood pressure is 138/86 in clinic, improved from her last visit. ? ?We reviewed her Echo (05/2021) results in detail. We discussed possible medicinal therapies and procedural details of a heart catheterization at length. ? ?She denies any palpitations, chest pain, shortness of breath, or peripheral edema. No lightheadedness, headaches, syncope, orthopnea, or PND. ? ?Of note, she endorses a phobia of needles. She also plans to go on a cruise 10/05/21. ? ?Past Medical History:  ?Diagnosis Date  ? Acid reflux   ? Anxiety   ? Depression   ? History of kidney stones   ? ? ?Past Surgical History:  ?Procedure Laterality Date  ? ABDOMINAL HYSTERECTOMY    ? over 30 years ago  ? EXTRACORPOREAL SHOCK WAVE LITHOTRIPSY Left 08/23/2018  ? Procedure: EXTRACORPOREAL SHOCK WAVE LITHOTRIPSY (ESWL);  Surgeon: Franchot Gallo, MD;  Location: [facility];  Service: Urology;  Laterality: Left;  ? HAND SURGERY Right   ? x2  on right hand, carpal tunnel, bone removed  ? LITHOTRIPSY  2007  ? REDUCTION MAMMAPLASTY    ? 20 years ago  ? ? ?Current Medications: ?Current Outpatient Medications on File Prior to Visit  ?Medication Sig  ? aspirin  EC 81 MG tablet Take 1 tablet (81 mg total) by mouth daily. Swallow whole.  ? buPROPion (WELLBUTRIN) 75 MG tablet Take 75 mg by mouth 2 (two) times daily.   ? omeprazole (PRILOSEC) 40 MG capsule Take 40 mg by mouth daily.   ? rosuvastatin (CRESTOR) 10 MG tablet Take 1 tablet (10 mg total) by mouth daily.  ? ?No current facility-administered medications on file prior to visit.  ?  ? ?Allergies:   Albuterol and Sulfamethoxazole  ? ?Social History  ? ?Tobacco Use  ? Smoking status: Never  ? Smokeless tobacco: Never  ?Vaping Use  ? Vaping Use: Never used  ?Substance Use Topics  ? Alcohol use: Yes  ?  Comment: 2x week  ? Drug use: Never  ? ? ?Family History: ?family history includes Breast cancer in an other family member. ? ?ROS:   ?Please see the history of present illness. ?All other systems are reviewed and negative.  ? ? ?EKGs/Labs/Other Studies Reviewed:   ? ?The following studies were reviewed today: ? ?Echo 05/28/2021: ?Sonographer Comments: Suboptimal parasternal window, suboptimal apical  ?window and patient is morbidly obese. Patient declined Definity use  ?IMPRESSIONS  ? ? 1. Diffuse hypokinesis worse in the septum and apex. Left ventricular  ?ejection fraction, by estimation, is 30 to 35%. The left ventricle has  ?moderately decreased function. The left ventricle demonstrates global  ?hypokinesis. The left ventricular internal  ?cavity size was moderately dilated. Left ventricular diastolic parameters  ?are indeterminate.  ? 2. Right ventricular systolic function is normal. The right ventricular  ?size is normal.  ? 3. The mitral valve is abnormal. Moderate mitral valve regurgitation. No  ?evidence of mitral stenosis.  ? 4. The aortic valve is tricuspid. There is mild calcification of the  ?aortic valve. Aortic valve regurgitation is trivial. Aortic valve  ?sclerosis is present, with no evidence of aortic valve stenosis.  ? 5. The inferior vena cava is normal in size with greater than 50%  ?respiratory  variability, suggesting right atrial pressure of 3 mmHg.  ? ?CTA Chest 06/28/2019: ?FINDINGS: ?Cardiovascular: No filling defects within the pulmonary arteries ?arteries to suggest acute pulmonary embolism. ?  ?Mediastinum/Nodes: No axillary or supraclavicular adenopathy. No ?mediastinal hilar adenopathy. No pericardial effusion. Esophagus ?normal. ?  ?Lungs/Pleura: Mild peripheral linear interstitial thickening. Mild ?peripheral ground-glass opacities in the LEFT upper lobe and LEFT ?lower lobe. No focal consolidation. No discrete airspace disease. ?Airways normal. No pulmonary infarction. ?  ?Upper Abdomen: Limited view of the liver, kidneys, pancreas are ?unremarkable. Normal adrenal glands. ?  ?Musculoskeletal: No aggressive osseous lesion. ?  ?Review of the MIP images confirms the above findings. ?  ?IMPRESSION: ?1. Peripheral interstitial thickening and mild ground-glass ?opacities in lungs are sequelae of resolving COVID infection. No ?focal consolidation. ?2. No evidence acute pulmonary embolism. ? ?RLE Venous Doppler 07/17/2016: ? Summary:  ? ?- No evidence of deep vein thrombosis involving the visualized  ?  veins of the right lower extremity.  ?- No evidence of Baker&'s cyst in the popliteal fossa although there  ?  does appear to be a small pocket of fluid near the gastronemius  ?  muscle in the proximal calf, possible ruptured cyst, 4.4 x 3.9cm?  ? ?EKG:  EKG is personally reviewed.   ?06/17/2021: NSR at 90 bpm, LBBB QRS 136 ms ?05/20/2021: NSR at 87 bpm, IVCB ? ?Recent Labs: ?No results found for requested labs within last 8760 hours.  ? ?Recent Lipid Panel ?   ?Component Value Date/Time  ? TRIG 57 04/23/2019 2219  ? ? ?Physical Exam:   ? ?VS:  BP 138/86 (BP Location: Left Arm, Patient Position: Sitting, Cuff Size: Normal)   Pulse 95   Ht 5' (1.524 m)   Wt 159 lb 3.2 oz (72.2 kg)   SpO2 99%   BMI 31.09 kg/m?    ? ?Wt Readings from Last 3 Encounters:  ?06/17/21 159 lb 3.2 oz (72.2 kg)  ?05/20/21 159 lb  14.4 oz (72.5 kg)  ?08/24/19 163 lb (73.9 kg)  ?  ?GEN: Well nourished, well developed in no acute distress ?HEENT: Normal, moist mucous membranes ?NECK: No JVD ?CARDIAC: regular rhythm, normal S1 and S2, no rubs or

## 2021-06-17 NOTE — Progress Notes (Signed)
Cardiology Office Note:    Date:  06/17/2021   ID:  Lauren Peters, Lauren Peters 06-22-1955, MRN [id number]  PCP:  Kathyrn Lass, MD  Cardiologist:  Buford Dresser, MD  Referring MD: Kathyrn Lass, MD   CC: Follow-up, Discuss Echo results   History of Present Illness:    Lauren Peters is a 66 y.o. female with a hx of acid reflux, nephrolithiasis, and depression, who is seen for follow-up. I initially met her 05/20/2021 as a new consult at the request of Kathyrn Lass, MD for the evaluation and management of persistent shortness of breath post COVID.  Note from Dr. Sabra Heck dated 05/01/21 reviewed. Patient noted persistent shortness of breath post Covid, referred to cardiology for further evaluation. Initial Covid infestion 04/2019, second infection 09/2020, with persistent cough and dyspnea on exertion. Note from Jillyn Ledger from 03/21/21 noted history of aortic atherosclerosis and recommended consideration of a statin.   Saw Dr. Vaughan Browner once after her Covid hospitalization. CTA 06/28/2019-no pulmonary embolism, peripheral interstitial thickening, mild groundglass in alternate pattern.  Cardiovascular risk factors: Prior clinical ASCVD: She denies any cardiovascular issues prior to her initial COVID infection about 3 years ago. She developed a second COVID infection 09/2020. She was discharged on oxygen. Since then her breathing has never returned to baseline, but has still improved.  Comorbid conditions: Hypertension - 20 years ago she was started on antihypertensives (discontinued 2 months later due to cough and low blood pressures). Since her antihypertensives were discontinued she did not begin any subsequent therapy, and her high blood pressure was thought to be due to white coat syndrome. Metabolic syndrome/Obesity: Highest adult weight is about 164 lbs, today's weight is 159 lbs. Chronic inflammatory conditions: Tobacco use history: Former smoker, quit about 40 years ago. Alcohol: Limited to weekends.  3-4 drinks at most. Family history: Her grandparents both had strokes. Her mother had a heart attack about 5 years ago, and is a patient of Dr. Oval Linsey. Her father had Parkinson's.  Prior cardiac testing and/or incidental findings on other testing (ie coronary calcium): Aortic atherosclerosis noted on prior CT scan. Exercise level: Retired 1 year ago. Fairly active, typically she will walk quickly. She enjoys kayaking. She will become winded with climbing stairs. However, she denies ever needing to completely stop her activities/exercise due to her shortness of breath. Current diet: Enjoys vegetables, avoids fruits. Steak, burgers, salads. Has chicken more often than beef, and salmon 1-2 times a month.   Today: She is accompanied by her daughter. Overall, she is feeling good with no new complaints. Her blood pressure is 138/86 in clinic, improved from her last visit.  We reviewed her Echo (05/2021) results in detail. We discussed possible medicinal therapies and procedural details of a heart catheterization at length.  She denies any palpitations, chest pain, shortness of breath, or peripheral edema. No lightheadedness, headaches, syncope, orthopnea, or PND.  Of note, she endorses a phobia of needles. She also plans to go on a cruise 10/05/21.  Past Medical History:  Diagnosis Date   Acid reflux    Anxiety    Depression    History of kidney stones     Past Surgical History:  Procedure Laterality Date   ABDOMINAL HYSTERECTOMY     over 30 years ago   EXTRACORPOREAL SHOCK WAVE LITHOTRIPSY Left 08/23/2018   Procedure: EXTRACORPOREAL SHOCK WAVE LITHOTRIPSY (ESWL);  Surgeon: Franchot Gallo, MD;  Location: [facility];  Service: Urology;  Laterality: Left;   HAND SURGERY Right    x2  on right hand, carpal tunnel, bone removed   LITHOTRIPSY  2007   REDUCTION MAMMAPLASTY     20 years ago    Current Medications: Current Outpatient Medications on File Prior to Visit  Medication Sig   aspirin  EC 81 MG tablet Take 1 tablet (81 mg total) by mouth daily. Swallow whole.   buPROPion (WELLBUTRIN) 75 MG tablet Take 75 mg by mouth 2 (two) times daily.    omeprazole (PRILOSEC) 40 MG capsule Take 40 mg by mouth daily.    rosuvastatin (CRESTOR) 10 MG tablet Take 1 tablet (10 mg total) by mouth daily.   No current facility-administered medications on file prior to visit.     Allergies:   Albuterol and Sulfamethoxazole   Social History   Tobacco Use   Smoking status: Never   Smokeless tobacco: Never  Vaping Use   Vaping Use: Never used  Substance Use Topics   Alcohol use: Yes    Comment: 2x week   Drug use: Never    Family History: family history includes Breast cancer in an other family member.  ROS:   Please see the history of present illness. All other systems are reviewed and negative.    EKGs/Labs/Other Studies Reviewed:    The following studies were reviewed today:  Echo 05/28/2021: Sonographer Comments: Suboptimal parasternal window, suboptimal apical  window and patient is morbidly obese. Patient declined Definity use  IMPRESSIONS    1. Diffuse hypokinesis worse in the septum and apex. Left ventricular  ejection fraction, by estimation, is 30 to 35%. The left ventricle has  moderately decreased function. The left ventricle demonstrates global  hypokinesis. The left ventricular internal  cavity size was moderately dilated. Left ventricular diastolic parameters  are indeterminate.   2. Right ventricular systolic function is normal. The right ventricular  size is normal.   3. The mitral valve is abnormal. Moderate mitral valve regurgitation. No  evidence of mitral stenosis.   4. The aortic valve is tricuspid. There is mild calcification of the  aortic valve. Aortic valve regurgitation is trivial. Aortic valve  sclerosis is present, with no evidence of aortic valve stenosis.   5. The inferior vena cava is normal in size with greater than 50%  respiratory  variability, suggesting right atrial pressure of 3 mmHg.   CTA Chest 06/28/2019: FINDINGS: Cardiovascular: No filling defects within the pulmonary arteries arteries to suggest acute pulmonary embolism.   Mediastinum/Nodes: No axillary or supraclavicular adenopathy. No mediastinal hilar adenopathy. No pericardial effusion. Esophagus normal.   Lungs/Pleura: Mild peripheral linear interstitial thickening. Mild peripheral ground-glass opacities in the LEFT upper lobe and LEFT lower lobe. No focal consolidation. No discrete airspace disease. Airways normal. No pulmonary infarction.   Upper Abdomen: Limited view of the liver, kidneys, pancreas are unremarkable. Normal adrenal glands.   Musculoskeletal: No aggressive osseous lesion.   Review of the MIP images confirms the above findings.   IMPRESSION: 1. Peripheral interstitial thickening and mild ground-glass opacities in lungs are sequelae of resolving COVID infection. No focal consolidation. 2. No evidence acute pulmonary embolism.  RLE Venous Doppler 07/17/2016:  Summary:   - No evidence of deep vein thrombosis involving the visualized    veins of the right lower extremity.  - No evidence of Baker&'s cyst in the popliteal fossa although there    does appear to be a small pocket of fluid near the gastronemius    muscle in the proximal calf, possible ruptured cyst, 4.4 x 3.9cm?   EKG:  EKG is personally reviewed.   06/17/2021: NSR at 90 bpm, LBBB QRS 136 ms 05/20/2021: NSR at 87 bpm, IVCB  Recent Labs: No results found for requested labs within last 8760 hours.   Recent Lipid Panel    Component Value Date/Time   TRIG 57 04/23/2019 2219    Physical Exam:    VS:  BP 138/86 (BP Location: Left Arm, Patient Position: Sitting, Cuff Size: Normal)    Pulse 95    Ht 5' (1.524 m)    Wt 159 lb 3.2 oz (72.2 kg)    SpO2 99%    BMI 31.09 kg/m     Wt Readings from Last 3 Encounters:  06/17/21 159 lb 3.2 oz (72.2 kg)  05/20/21 159 lb  14.4 oz (72.5 kg)  08/24/19 163 lb (73.9 kg)    GEN: Well nourished, well developed in no acute distress HEENT: Normal, moist mucous membranes NECK: No JVD CARDIAC: regular rhythm, normal S1 and S2, no rubs or gallops. No murmur. VASCULAR: Radial and DP pulses 2+ bilaterally. No carotid bruits RESPIRATORY:  Clear to auscultation without rales, wheezing or rhonchi  ABDOMEN: Soft, non-tender, non-distended MUSCULOSKELETAL:  Ambulates independently SKIN: Warm and dry, no edema NEUROLOGIC:  Alert and oriented x 3. No focal neuro deficits noted. PSYCHIATRIC:  Normal affect    ASSESSMENT:    1. Dilated cardiomyopathy (HCC)   2. Dyspnea on exertion   3. Aortic atherosclerosis (HCC)   4. Pure hypercholesterolemia   5. History of COVID-19   6. Pre-procedure lab exam   7. Medication management   8. LBBB (left bundle branch block)     PLAN:    Dyspnea on exertion History of Covid-19 Dilated cardiomyopathy on echo LBBB -we discussed ischemic vs nonischemic cardiomyopathy, medical management recommendations -we discussed cath at length, she is willing to proceed Risks and benefits of cardiac catheterization have been discussed with the patient.  These include bleeding, infection, kidney damage, stroke, heart attack, death.  The patient understands these risks and is willing to proceed.  -will start metoprolol succinate 50 mg today -will start entresto after cath, will give samples -scheduled form 3/21 with Dr. Katrinka Blazing for [facility] -check TSH -recheck echo after 3 mos GDMT  Aortic atherosclerosis Hypercholesterolemia -tolerating rosuvastatin 10 mg daily, aspirin 81 mg daily. Started 05/2021 -per [facility] 04/22/21: Tchol 193, HDL 61, LDL 120, TG 67 -would aim for LDL <70 -recheck lipids/lfts next month  Cardiac risk counseling and prevention recommendations: -recommend heart healthy/Mediterranean diet, with whole grains, fruits, vegetable, fish, lean meats, nuts, and olive oil. Limit  salt. -recommend moderate walking, 3-5 times/week for 30-50 minutes each session. Aim for at least 150 minutes.week. Goal should be pace of 3 miles/hours, or walking 1.5 miles in 30 minutes -recommend avoidance of tobacco products. Avoid excess alcohol.   Plan for follow up: 1-2 weeks post cath  Total time of encounter: 40 minutes total time of encounter, including 33 minutes spent in face-to-face patient care. This time includes coordination of care and counseling regarding echo results, next steps, consent and arranging of cath. Remainder of non-face-to-face time involved reviewing chart documents/testing relevant to the patient encounter and documentation in the medical record.  Jodelle Red, MD, PhD, [facility]   [facility]    Medication Adjustments/Labs and Tests Ordered: Current medicines are reviewed at length with the patient today.  Concerns regarding medicines are outlined above.   Orders Placed This Encounter  Procedures   CBC   Basic metabolic panel  TSH   EKG 12-Lead   Meds ordered this encounter  Medications   metoprolol succinate (TOPROL-XL) 50 MG 24 hr tablet    Sig: Take 1 tablet (50 mg total) by mouth daily. Take with or immediately following a meal.    Dispense:  90 tablet    Refill:  3   sacubitril-valsartan (ENTRESTO) 24-26 MG    Sig: Take 1 tablet by mouth 2 (two) times daily.    Dispense:  60 tablet    Refill:  11   Patient Instructions  Medication Instructions:  1). START: Metoprolol Succinate 50 mg daily 2). START: Entresto 24-26 mg twice a day -DON'T START until 07/05/21 (3 days after Cath)  *If you need a refill on your cardiac medications before your next appointment, please call your pharmacy*   Lab Work: Your provider has recommended lab work today (CBC, BMP, TSH). Please have this collected at [facility] at [place]. The lab is open 8:00 am - 4:30 pm. Please avoid 12:00p - 1:00p for lunch hour. You do not need an  appointment. Please go to [address] [place], [place] [address]. This is in the Primary Care office on the 3rd floor, let them know you are there for blood work and they will direct you to the lab.  If you have labs (blood work) drawn today and your tests are completely normal, you will receive your results only by: Andrew (if you have MyChart) OR A paper copy in the mail If you have any lab test that is abnormal or we need to change your treatment, we will call you to review the results.   Testing/Procedures: Your physician has requested that you have a cardiac catheterization. Cardiac catheterization is used to diagnose and/or treat various heart conditions. Doctors may recommend this procedure for a number of different reasons. The most common reason is to evaluate chest pain. Chest pain can be a symptom of coronary artery disease (CAD), and cardiac catheterization can show whether plaque is narrowing or blocking your hearts arteries. This procedure is also used to evaluate the valves, as well as measure the blood flow and oxygen levels in different parts of your heart. For further information please visit [email or web address].[email or web address]. Please follow instruction sheet, as given. [facility]   Follow-Up: At [facility], you and your health needs are our priority.  As part of our continuing mission to provide you with exceptional heart care, we have created designated Provider Care Teams.  These Care Teams include your primary Cardiologist (physician) and Advanced Practice Providers (APPs -  Physician Assistants and Nurse Practitioners) who all work together to provide you with the care you need, when you need it.  We recommend signing up for the patient portal called "MyChart".  Sign up information is provided on this After Visit Summary.  MyChart is used to connect with patients for Virtual Visits (Telemedicine).  Patients are able to view lab/test results, encounter  notes, upcoming appointments, etc.  Non-urgent messages can be sent to your provider as well.   To learn more about what you can do with MyChart, go to [email or web address].    Your next appointment:   1 week(s) after Cath procedure on 07/02/21  The format for your next appointment:   In Person  Provider:   Buford Dresser, MD    [address] [place] [id number] Dept: [place]  06/17/2021  You are  scheduled for a Cardiac Catheterization on Tuesday, March 21 with Dr. Daneen Schick.  1. Please arrive at the Main Entrance A at [facility]: [place], [place] [address] at 5:30 AM (This time is two hours before your procedure to ensure your preparation). Free valet parking service is available.   Special note: Every effort is made to have your procedure done on time. Please understand that emergencies sometimes delay scheduled procedures.  2. Diet: Do not eat solid foods after midnight.  You may have clear liquids until 5 AM upon the day of the procedure.  3. Labs: You will need to have blood drawn today (CBC, BMP, TSH)  4. Medication instructions in preparation for your procedure:   Contrast Allergy: No   On the morning of your procedure, take Aspirin and any morning medicines NOT listed above.  You may use sips of water.  5. Plan to go home the same day, you will only stay overnight if medically necessary. 6. You MUST have a responsible adult to drive you home. 7. An adult MUST be with you the first 24 hours after you arrive home. 8. Bring a current list of your medications, and the last time and date medication taken. 9. Bring ID and current insurance cards. 10.Please wear clothes that are easy to get on and off and wear slip-on shoes.  Thank you for allowing [place] to care for you!   -- [facility]      I,Mathew Stumpf,acting as a scribe for Buford Dresser, MD.,have documented all relevant documentation on the behalf of Buford Dresser, MD,as directed by  Buford Dresser, MD while in the presence of Buford Dresser, MD.  I, Buford Dresser, MD, have reviewed all documentation for this visit. The documentation on 06/17/21 for the exam, diagnosis, procedures, and orders are all accurate and complete.   Signed, Buford Dresser, MD PhD 06/17/2021 10:28 AM    [place]

## 2021-06-18 ENCOUNTER — Encounter (HOSPITAL_BASED_OUTPATIENT_CLINIC_OR_DEPARTMENT_OTHER): Payer: Self-pay | Admitting: Family

## 2021-06-18 DIAGNOSIS — I420 Dilated cardiomyopathy: Secondary | ICD-10-CM | POA: Diagnosis not present

## 2021-06-18 DIAGNOSIS — Z01812 Encounter for preprocedural laboratory examination: Secondary | ICD-10-CM | POA: Diagnosis not present

## 2021-06-18 DIAGNOSIS — Z79899 Other long term (current) drug therapy: Secondary | ICD-10-CM | POA: Diagnosis not present

## 2021-06-19 ENCOUNTER — Encounter: Payer: Self-pay | Admitting: Cardiology

## 2021-06-19 DIAGNOSIS — L821 Other seborrheic keratosis: Secondary | ICD-10-CM | POA: Diagnosis not present

## 2021-06-19 DIAGNOSIS — D1801 Hemangioma of skin and subcutaneous tissue: Secondary | ICD-10-CM | POA: Diagnosis not present

## 2021-06-19 DIAGNOSIS — L578 Other skin changes due to chronic exposure to nonionizing radiation: Secondary | ICD-10-CM | POA: Diagnosis not present

## 2021-06-19 DIAGNOSIS — D485 Neoplasm of uncertain behavior of skin: Secondary | ICD-10-CM | POA: Diagnosis not present

## 2021-06-19 DIAGNOSIS — Z23 Encounter for immunization: Secondary | ICD-10-CM | POA: Diagnosis not present

## 2021-06-19 DIAGNOSIS — L918 Other hypertrophic disorders of the skin: Secondary | ICD-10-CM | POA: Diagnosis not present

## 2021-06-19 DIAGNOSIS — L570 Actinic keratosis: Secondary | ICD-10-CM | POA: Diagnosis not present

## 2021-06-19 LAB — CBC
Hematocrit: 46.1 % (ref 34.0–46.6)
Hemoglobin: 15.4 g/dL (ref 11.1–15.9)
MCH: 29.3 pg (ref 26.6–33.0)
MCHC: 33.4 g/dL (ref 31.5–35.7)
MCV: 88 fL (ref 79–97)
Platelets: 277 10*3/uL (ref 150–450)
RBC: 5.26 x10E6/uL (ref 3.77–5.28)
RDW: 13.4 % (ref 11.7–15.4)
WBC: 6 10*3/uL (ref 3.4–10.8)

## 2021-06-19 LAB — BASIC METABOLIC PANEL
BUN/Creatinine Ratio: 15 (ref 12–28)
BUN: 14 mg/dL (ref 8–27)
CO2: 24 mmol/L (ref 20–29)
Calcium: 9.5 mg/dL (ref 8.7–10.3)
Chloride: 103 mmol/L (ref 96–106)
Creatinine, Ser: 0.96 mg/dL (ref 0.57–1.00)
Glucose: 99 mg/dL (ref 70–99)
Potassium: 4.2 mmol/L (ref 3.5–5.2)
Sodium: 142 mmol/L (ref 134–144)
eGFR: 65 mL/min/{1.73_m2} (ref >59)

## 2021-06-19 LAB — TSH: TSH: 1.75 u[IU]/mL (ref 0.450–4.500)

## 2021-06-21 ENCOUNTER — Telehealth: Payer: Self-pay | Admitting: Cardiology

## 2021-06-21 NOTE — Telephone Encounter (Signed)
Pt has some questions about her upcoming procedure... please advise ?

## 2021-06-21 NOTE — Telephone Encounter (Signed)
Patient also sent mychart message, I have responded to her and I am waiting to hear back  ?

## 2021-06-24 NOTE — Telephone Encounter (Signed)
Here ya go!  

## 2021-06-24 NOTE — Telephone Encounter (Signed)
-  Pt called stating she is scheduled for a Cath on 3/21 but wanted to know if she is ok to attend a cocktail party on 3/25. ?-Per Dr. Harrell Gave, pt ok to attend but to minimize alcohol consumption. ?-Pt made aware and verbalized understanding.  ?

## 2021-06-28 ENCOUNTER — Telehealth (HOSPITAL_BASED_OUTPATIENT_CLINIC_OR_DEPARTMENT_OTHER): Payer: Self-pay | Admitting: Cardiology

## 2021-06-28 DIAGNOSIS — I420 Dilated cardiomyopathy: Secondary | ICD-10-CM

## 2021-06-28 MED ORDER — METOPROLOL SUCCINATE ER 25 MG PO TB24: ORAL_TABLET | ORAL | 6 refills | Status: DC

## 2021-06-28 NOTE — Telephone Encounter (Signed)
Yes, 50 mg is a decent dose for the first time on a beta blocker. Patient could cut in half a try '25mg'$  daily. However she is on wellbutrin which can increase the concentration of metoprolol. I would actually recommend sending in an rx for the '25mg'$  tablets and having her take 1/2 (12.'5mg'$ ) to start off with. ?

## 2021-06-28 NOTE — Telephone Encounter (Signed)
Spoke to patient pharmacist advice given.She will take Toprol 25 mg 1/2 tablet daily.Advised to call back if she continues to have symptoms. ?

## 2021-06-28 NOTE — Telephone Encounter (Signed)
Ok to advise a smaller dose?  ?

## 2021-06-28 NOTE — Telephone Encounter (Signed)
Pt c/o medication issue: ? ?1. Name of Medication: metoprolol succinate (TOPROL-XL) 50 MG 24 hr tablet ? ?2. How are you currently taking this medication (dosage and times per day)? Take 1 tablet (50 mg total) by mouth daily. Take with or immediately following a meal. ? ?3. Are you having a reaction (difficulty breathing--STAT)? no ? ?4. What is your medication issue? Medication is causing her to be sick nausea, dizziness and extra tired. No energy  ? ?

## 2021-07-01 ENCOUNTER — Telehealth: Payer: Self-pay | Admitting: *Deleted

## 2021-07-01 NOTE — H&P (Signed)
Ejection fraction 35% by echo.  Etiology uncertain. ?Left and right heart cath to define anatomy and document hemodynamics. ?

## 2021-07-01 NOTE — Telephone Encounter (Signed)
Cardiac Catheterization scheduled at [facility] for: Tuesday March 21.2023 7:30 AM ?Arrival time and place: [facility] Entrance A at: 5:30 AM ? ? ?No solid food after midnight prior to cath, clear liquids until 5 AM day of procedure. ? ?Medication instructions: ?-Usual morning medications can be taken with sips of water including aspirin 81 mg. ? ?Confirmed patient has responsible adult to drive home post procedure and be with patient first 24 hours after arriving home: ? ?One visitor is allowed to stay in the waiting room during the time you are at the hospital for your procedure.  ? ?Patient reports no symptoms concerning for COVID-19/no exposure to COVID-19 in the past 10 days. ? ?Reviewed procedure instructions with patient.  ? ?

## 2021-07-01 NOTE — Telephone Encounter (Signed)
Patient states she planned to have two visitors with her tomorrow, patient is aware [facility] procedure visitor policy allows one visitor. ?I reviewed with patient and emphasized current visitor policy for procedure is that patient may have one visitor over age 66 to stay in the waiting area during the time she is at the hospital for the procedure and may switch out with other visitors. ?Patient is aware if overnight stay is necessary,she can have two visitors in her hospital room at one time from 7 AM-8 PM.  ? ? ?

## 2021-07-02 ENCOUNTER — Encounter (HOSPITAL_COMMUNITY)
Admission: RE | Disposition: A | Discharge status: Home / Self Care | Payer: Self-pay | Source: Home / Self Care | Attending: Interventional Cardiology

## 2021-07-02 ENCOUNTER — Encounter (HOSPITAL_COMMUNITY): Payer: Self-pay | Admitting: Interventional Cardiology

## 2021-07-02 ENCOUNTER — Ambulatory Visit (HOSPITAL_COMMUNITY)
Admission: RE | Admit: 2021-07-02 | Discharge: 2021-07-02 | Disposition: A | Discharge status: Home / Self Care | Payer: Medicare HMO | Attending: Interventional Cardiology | Admitting: Interventional Cardiology

## 2021-07-02 ENCOUNTER — Other Ambulatory Visit: Payer: Self-pay

## 2021-07-02 ENCOUNTER — Telehealth (HOSPITAL_BASED_OUTPATIENT_CLINIC_OR_DEPARTMENT_OTHER): Payer: Self-pay | Admitting: Cardiology

## 2021-07-02 DIAGNOSIS — I700 Atherosclerosis of aorta: Secondary | ICD-10-CM | POA: Diagnosis present

## 2021-07-02 DIAGNOSIS — R0609 Other forms of dyspnea: Secondary | ICD-10-CM | POA: Diagnosis not present

## 2021-07-02 DIAGNOSIS — Z7982 Long term (current) use of aspirin: Secondary | ICD-10-CM | POA: Diagnosis not present

## 2021-07-02 DIAGNOSIS — Z8616 Personal history of COVID-19: Secondary | ICD-10-CM | POA: Insufficient documentation

## 2021-07-02 DIAGNOSIS — Z79899 Other long term (current) drug therapy: Secondary | ICD-10-CM | POA: Insufficient documentation

## 2021-07-02 DIAGNOSIS — I272 Pulmonary hypertension, unspecified: Secondary | ICD-10-CM | POA: Diagnosis not present

## 2021-07-02 DIAGNOSIS — E7800 Pure hypercholesterolemia, unspecified: Secondary | ICD-10-CM | POA: Diagnosis not present

## 2021-07-02 DIAGNOSIS — I420 Dilated cardiomyopathy: Secondary | ICD-10-CM | POA: Diagnosis not present

## 2021-07-02 DIAGNOSIS — D696 Thrombocytopenia, unspecified: Secondary | ICD-10-CM | POA: Diagnosis present

## 2021-07-02 DIAGNOSIS — I447 Left bundle-branch block, unspecified: Secondary | ICD-10-CM | POA: Diagnosis not present

## 2021-07-02 HISTORY — PX: RIGHT/LEFT HEART CATH AND CORONARY ANGIOGRAPHY: CATH118266

## 2021-07-02 LAB — POCT I-STAT EG7
Acid-Base Excess: 0 mmol/L (ref 0.0–2.0)
Acid-base deficit: 1 mmol/L (ref 0.0–2.0)
Bicarbonate: 25.5 mmol/L (ref 20.0–28.0)
Bicarbonate: 25.7 mmol/L (ref 20.0–28.0)
Calcium, Ion: 1.24 mmol/L (ref 1.15–1.40)
Calcium, Ion: 1.24 mmol/L (ref 1.15–1.40)
HCT: 39 % (ref 36.0–46.0)
HCT: 39 % (ref 36.0–46.0)
Hemoglobin: 13.3 g/dL (ref 12.0–15.0)
Hemoglobin: 13.3 g/dL (ref 12.0–15.0)
O2 Saturation: 71 %
O2 Saturation: 72 %
Potassium: 3.8 mmol/L (ref 3.5–5.1)
Potassium: 3.9 mmol/L (ref 3.5–5.1)
Sodium: 141 mmol/L (ref 135–145)
Sodium: 142 mmol/L (ref 135–145)
TCO2: 27 mmol/L (ref 22–32)
TCO2: 27 mmol/L (ref 22–32)
pCO2, Ven: 46.1 mmHg (ref 44–60)
pCO2, Ven: 46.1 mmHg (ref 44–60)
pH, Ven: 7.351 (ref 7.25–7.43)
pH, Ven: 7.353 (ref 7.25–7.43)
pO2, Ven: 39 mmHg (ref 32–45)
pO2, Ven: 40 mmHg (ref 32–45)

## 2021-07-02 LAB — POCT I-STAT 7, (LYTES, BLD GAS, ICA,H+H)
Acid-Base Excess: 0 mmol/L (ref 0.0–2.0)
Bicarbonate: 25.8 mmol/L (ref 20.0–28.0)
Calcium, Ion: 1.22 mmol/L (ref 1.15–1.40)
HCT: 38 % (ref 36.0–46.0)
Hemoglobin: 12.9 g/dL (ref 12.0–15.0)
O2 Saturation: 93 %
Potassium: 3.9 mmol/L (ref 3.5–5.1)
Sodium: 140 mmol/L (ref 135–145)
TCO2: 27 mmol/L (ref 22–32)
pCO2 arterial: 45.5 mmHg (ref 32–48)
pH, Arterial: 7.362 (ref 7.35–7.45)
pO2, Arterial: 71 mmHg — ABNORMAL LOW (ref 83–108)

## 2021-07-02 SURGERY — RIGHT/LEFT HEART CATH AND CORONARY ANGIOGRAPHY
Anesthesia: LOCAL

## 2021-07-02 MED ORDER — MIDAZOLAM HCL 2 MG/2ML IJ SOLN
INTRAMUSCULAR | Status: DC | PRN
Start: 2021-07-02 — End: 2021-07-02
  Administered 2021-07-02: .5 mg via INTRAVENOUS
  Administered 2021-07-02 (×2): 1 mg via INTRAVENOUS

## 2021-07-02 MED ORDER — HEPARIN SODIUM (PORCINE) 1000 UNIT/ML IJ SOLN
INTRAMUSCULAR | Status: AC
  Filled 2021-07-02: qty 10

## 2021-07-02 MED ORDER — SODIUM CHLORIDE 0.9 % IV SOLN: INTRAVENOUS | Status: DC

## 2021-07-02 MED ORDER — ONDANSETRON HCL 4 MG/2ML IJ SOLN: 4.0000 mg | Freq: Four times a day (QID) | INTRAMUSCULAR | Status: DC | PRN

## 2021-07-02 MED ORDER — HEPARIN (PORCINE) IN NACL 1000-0.9 UT/500ML-% IV SOLN
INTRAVENOUS | Status: AC
  Filled 2021-07-02: qty 500

## 2021-07-02 MED ORDER — SODIUM CHLORIDE 0.9 % IV SOLN
250.0000 mL | INTRAVENOUS | Status: DC | PRN
Start: 2021-07-02 — End: 2021-07-02

## 2021-07-02 MED ORDER — SODIUM CHLORIDE 0.9 % IV SOLN: 250.0000 mL | INTRAVENOUS | Status: DC | PRN

## 2021-07-02 MED ORDER — SODIUM CHLORIDE 0.9% FLUSH: 3.0000 mL | INTRAVENOUS | Status: DC | PRN

## 2021-07-02 MED ORDER — LIDOCAINE HCL (PF) 1 % IJ SOLN
INTRAMUSCULAR | Status: AC
  Filled 2021-07-02: qty 30

## 2021-07-02 MED ORDER — HEPARIN (PORCINE) IN NACL 1000-0.9 UT/500ML-% IV SOLN
INTRAVENOUS | Status: DC | PRN
  Administered 2021-07-02 (×2): 500 mL

## 2021-07-02 MED ORDER — ASPIRIN 81 MG PO CHEW: 81.0000 mg | CHEWABLE_TABLET | ORAL | Status: DC

## 2021-07-02 MED ORDER — FENTANYL CITRATE (PF) 100 MCG/2ML IJ SOLN
INTRAMUSCULAR | Status: AC
  Filled 2021-07-02: qty 2

## 2021-07-02 MED ORDER — HYDRALAZINE HCL 20 MG/ML IJ SOLN: 10.0000 mg | INTRAMUSCULAR | Status: DC | PRN

## 2021-07-02 MED ORDER — SODIUM CHLORIDE 0.9% FLUSH: 3.0000 mL | Freq: Two times a day (BID) | INTRAVENOUS | Status: DC

## 2021-07-02 MED ORDER — MIDAZOLAM HCL 2 MG/2ML IJ SOLN
INTRAMUSCULAR | Status: AC
  Filled 2021-07-02: qty 2

## 2021-07-02 MED ORDER — FENTANYL CITRATE (PF) 100 MCG/2ML IJ SOLN
INTRAMUSCULAR | Status: DC | PRN
  Administered 2021-07-02 (×3): 25 ug via INTRAVENOUS

## 2021-07-02 MED ORDER — IOHEXOL 350 MG/ML SOLN
INTRAVENOUS | Status: DC | PRN
  Administered 2021-07-02: 25 mL

## 2021-07-02 MED ORDER — LIDOCAINE HCL (PF) 1 % IJ SOLN
INTRAMUSCULAR | Status: DC | PRN
  Administered 2021-07-02: 2 mL

## 2021-07-02 MED ORDER — MIDAZOLAM HCL 2 MG/2ML IJ SOLN
INTRAMUSCULAR | Status: AC
Start: 2021-07-02 — End: ?
  Filled 2021-07-02: qty 2

## 2021-07-02 MED ORDER — VERAPAMIL HCL 2.5 MG/ML IV SOLN
INTRAVENOUS | Status: DC | PRN
  Administered 2021-07-02: 10 mL via INTRA_ARTERIAL

## 2021-07-02 MED ORDER — HEPARIN SODIUM (PORCINE) 1000 UNIT/ML IJ SOLN
INTRAMUSCULAR | Status: DC | PRN
  Administered 2021-07-02: 3500 [IU] via INTRAVENOUS

## 2021-07-02 MED ORDER — VERAPAMIL HCL 2.5 MG/ML IV SOLN
INTRAVENOUS | Status: AC
  Filled 2021-07-02: qty 2

## 2021-07-02 MED ORDER — ACETAMINOPHEN 325 MG PO TABS: 650.0000 mg | ORAL_TABLET | ORAL | Status: DC | PRN

## 2021-07-02 MED ORDER — LABETALOL HCL 5 MG/ML IV SOLN: 10.0000 mg | INTRAVENOUS | Status: DC | PRN

## 2021-07-02 SURGICAL SUPPLY — 16 items
BAND CMPR LRG ZPHR (HEMOSTASIS)
BAND ZEPHYR COMPRESS 30 LONG (HEMOSTASIS) IMPLANT
CATH BALLN WEDGE 5F 110CM (CATHETERS) ×1 IMPLANT
CATH INFINITI 5 FR JL3.5 (CATHETERS) ×1 IMPLANT
CATH INFINITI JR4 5F (CATHETERS) ×1 IMPLANT
DEVICE RAD COMP TR BAND LRG (VASCULAR PRODUCTS) ×1 IMPLANT
GLIDESHEATH SLEND A-KIT 6F 22G (SHEATH) ×1 IMPLANT
GUIDEWIRE .025 260CM (WIRE) ×1 IMPLANT
GUIDEWIRE INQWIRE 1.5J.035X260 (WIRE) IMPLANT
INQWIRE 1.5J .035X260CM (WIRE) ×2
KIT HEART LEFT (KITS) ×2 IMPLANT
PACK CARDIAC CATHETERIZATION (CUSTOM PROCEDURE TRAY) ×2 IMPLANT
SHEATH GLIDE SLENDER 4/5FR (SHEATH) ×1 IMPLANT
SHEATH PROBE COVER 6X72 (BAG) ×1 IMPLANT
TRANSDUCER W/STOPCOCK (MISCELLANEOUS) ×2 IMPLANT
TUBING CIL FLEX 10 FLL-RA (TUBING) ×2 IMPLANT

## 2021-07-02 NOTE — Telephone Encounter (Signed)
RN returned call to patient.  ? ?Patient endorses swelling above the elbow where her cath site is located. Patient states she removed her board for a little bit and it made the swelling a little better and put the board back on. ? ? ?RN consulted with Dr. Harrell Gave MD, DOD in clinic and had advised the patient of the following recommendations ? ?Check for compartment syndrome- if any symptoms present- pt. To ED  ? ?If not elevate for the day and we can reassess tomorrow  ? ? ?Patient is not experiencing any signs or symptoms of compartment syndrome, RN to follow up tomorrow morning and see how she is doing ? ?  ?

## 2021-07-02 NOTE — CV Procedure (Addendum)
Normal coronary arteries.  Right dominant anatomy. ?Normal right heart pressures. ?Right ulnar arterial access.  Right radial is patent but very small and not felt large enough to accept an arterial sheath without discomfort and risk of perforation.  Real-time vascular ultrasound demonstrated arterial flow.  Real-time ultrasound was used for ulnar access. ?No complications. ?Guideline directed therapy for systolic heart failure.  Consider resynchronization if LV function does not normalize with medical therapy ?

## 2021-07-02 NOTE — Interval H&P Note (Signed)
Cath Lab Visit (complete for each Cath Lab visit) ? ?Clinical Evaluation Leading to the Procedure:  ? ?ACS: No. ? ?Non-ACS:   ? ?Anginal Classification: CCS II ? ?Anti-ischemic medical therapy: Minimal Therapy (1 class of medications) ? ?Non-Invasive Test Results: No non-invasive testing performed ? ?Prior CABG: No previous CABG ? ? ? ? ? ?History and Physical Interval Note: ? ?07/02/2021 ?7:34 AM ? ?Lauren Peters  has presented today for surgery, with the diagnosis of cardiomyopathy.  The various methods of treatment have been discussed with the patient and family. After consideration of risks, benefits and other options for treatment, the patient has consented to  Procedure(s): ?RIGHT/LEFT HEART CATH AND CORONARY ANGIOGRAPHY (N/A) as a surgical intervention.  The patient's history has been reviewed, patient examined, no change in status, stable for surgery.  I have reviewed the patient's chart and labs.  Questions were answered to the patient's satisfaction.   ? ? ?Lauren Peters ? ? ?

## 2021-07-02 NOTE — Telephone Encounter (Signed)
New Message: ? ? ? ? ?Patient had a Cath today and have some concerns about the bruising from it. ?

## 2021-07-03 ENCOUNTER — Telehealth (HOSPITAL_BASED_OUTPATIENT_CLINIC_OR_DEPARTMENT_OTHER): Payer: Self-pay

## 2021-07-03 ENCOUNTER — Encounter (HOSPITAL_BASED_OUTPATIENT_CLINIC_OR_DEPARTMENT_OTHER): Payer: Self-pay

## 2021-07-03 NOTE — Telephone Encounter (Signed)
Left message for patient to call back  

## 2021-07-08 NOTE — Progress Notes (Signed)
?Cardiology Office Note:   ? ?Date:  07/10/2021  ? ?ID:  Lauren Peters, DOB January 02, 1956, MRN [id number] ? ?PCP:  Lauren Lass, MD  ?Cardiologist:  Lauren Dresser, MD ? ?Referring MD: Lauren Lass, MD  ? ?CC: Follow-up, Discuss Cath results  ? ?History of Present Illness:   ? ?Lauren Peters is a 66 y.o. female with a hx of nonischemic cardiomyopathy, acid reflux, nephrolithiasis, and depression, who is seen for follow-up. I initially met her 05/20/2021 as a new consult at the request of Lauren Lass, MD for the evaluation and management of persistent shortness of breath post COVID. ? ?Cardiac history: Initial Covid infestion 04/2019, second infection 09/2020, with persistent cough and dyspnea on exertion. Note from Lauren Peters from 03/21/21 noted history of aortic atherosclerosis and recommended consideration of a statin.  ? ?Saw Dr. Vaughan Peters once after her Covid hospitalization. CTA 06/28/2019-no pulmonary embolism, peripheral interstitial thickening, mild groundglass in alternate pattern. ? ?Comorbid conditions: Hypertension - 20 years ago she was started on antihypertensives (discontinued 2 months later due to cough and low blood pressures). Since her antihypertensives were discontinued she did not begin any subsequent therapy, and her high blood pressure was thought to be due to white coat syndrome. ? ?Tobacco use history: Former smoker, quit about 40 years ago. ?Alcohol: Limited to weekends. 3-4 drinks at most. ?Family history: Her grandparents both had strokes. Her mother had a heart attack about 5 years ago, and is a patient of Dr. Oval Peters. Her father had Parkinson's.  ? ?Today: ?Since her last visit she had a heart catheterization 07/02/2021. We reviewed these results in detail today. ? ?Overall, she appears well. However, she continues to suffer from an intermittent cough. She notes that while she is sitting here at rest she feels fine. If she gets up and walks across the floor, she becomes short of breath and  starts coughing. ? ?She has not yet started [facility] because she was not sure of the exact instructions. When she was taking 50 mg metoprolol daily, she developed side effects of having no energy, and feeling severely nauseated in the mornings. This was recently reduced to 25 mg, and she has been tolerating this dose well. ? ?She denies any palpitations, chest pain, or peripheral edema. No lightheadedness, headaches, syncope, orthopnea, or PND. ? ?Past Medical History:  ?Diagnosis Date  ? Acid reflux   ? Anxiety   ? Depression   ? History of kidney stones   ? ? ?Past Surgical History:  ?Procedure Laterality Date  ? ABDOMINAL HYSTERECTOMY    ? over 30 years ago  ? EXTRACORPOREAL SHOCK WAVE LITHOTRIPSY Left 08/23/2018  ? Procedure: EXTRACORPOREAL SHOCK WAVE LITHOTRIPSY (ESWL);  Surgeon: Lauren Gallo, MD;  Location: [facility];  Service: Urology;  Laterality: Left;  ? HAND SURGERY Right   ? x2 on right hand, carpal tunnel, bone removed  ? LITHOTRIPSY  2007  ? REDUCTION MAMMAPLASTY    ? 20 years ago  ? RIGHT/LEFT HEART CATH AND CORONARY ANGIOGRAPHY N/A 07/02/2021  ? Procedure: RIGHT/LEFT HEART CATH AND CORONARY ANGIOGRAPHY;  Surgeon: Lauren Crome, MD;  Location: [facility];  Service: Cardiovascular;  Laterality: N/A;  ? ? ?Current Medications: ?Current Outpatient Medications on File Prior to Visit  ?Medication Sig  ? aspirin EC 81 MG tablet Take 1 tablet (81 mg total) by mouth daily. Swallow whole.  ? buPROPion (WELLBUTRIN) 75 MG tablet Take 75 mg by mouth in the morning.  ? clobetasol ointment (TEMOVATE) 0.05 %  Apply 1 application. topically 2 (two) times daily as needed (skin irritation.).  ? metoprolol succinate (TOPROL XL) 25 MG 24 hr tablet Take 1/2 tablet daily  ? naproxen sodium (ALEVE) 220 MG tablet Take 440 mg by mouth 2 (two) times daily as needed (pain.).  ? omeprazole (PRILOSEC) 40 MG capsule Take 40 mg by mouth daily.   ? rosuvastatin (CRESTOR) 10 MG tablet Take 1 tablet (10 mg total) by mouth  daily.  ? ?No current facility-administered medications on file prior to visit.  ?  ? ?Allergies:   Albuterol and Sulfamethoxazole  ? ?Social History  ? ?Tobacco Use  ? Smoking status: Never  ? Smokeless tobacco: Never  ?Vaping Use  ? Vaping Use: Never used  ?Substance Use Topics  ? Alcohol use: Yes  ?  Comment: 2x week  ? Drug use: Never  ? ? ?Family History: ?family history includes Breast cancer in an other family member. ? ?ROS:   ?Please see the history of present illness. ?(+) Cough ?(+) Shortness of breath ?All other systems are reviewed and negative.  ? ? ?EKGs/Labs/Other Studies Reviewed:   ? ?The following studies were reviewed today: ? ?Right/Left Heart Cath 07/02/2021: ?CONCLUSIONS: ?Normal coronary arteries with right dominant anatomy. ?LVEDP, 8 mmHg. ?Mean pulmonary artery pressure 21 mmHg, which based upon updated criteria is mild pulmonary hypertension.  Pulmonary wedge pressure mean 7 mmHg. ?Fick cardiac output 5 L/min. ?  ?RECOMMENDATIONS: ?Guideline directed therapy for systolic dysfunction as tolerated by blood pressure. ?QRST is 136 ms.  Resynchronization would be a consideration if medical therapy does not elevate the ejection fraction, however QRS duration is less than the 150 ms threshold that has the best response ? ?CTA Chest 06/28/2019: ?FINDINGS: ?Cardiovascular: No filling defects within the pulmonary arteries ?arteries to suggest acute pulmonary embolism. ?  ?Mediastinum/Nodes: No axillary or supraclavicular adenopathy. No ?mediastinal hilar adenopathy. No pericardial effusion. Esophagus ?normal. ?  ?Lungs/Pleura: Mild peripheral linear interstitial thickening. Mild ?peripheral ground-glass opacities in the LEFT upper lobe and LEFT ?lower lobe. No focal consolidation. No discrete airspace disease. ?Airways normal. No pulmonary infarction. ?  ?Upper Abdomen: Limited view of the liver, kidneys, pancreas are ?unremarkable. Normal adrenal glands. ?  ?Musculoskeletal: No aggressive osseous  lesion. ?  ?Review of the MIP images confirms the above findings. ?  ?IMPRESSION: ?1. Peripheral interstitial thickening and mild ground-glass ?opacities in lungs are sequelae of resolving COVID infection. No ?focal consolidation. ?2. No evidence acute pulmonary embolism. ? ?Echo 05/28/2021: ?Sonographer Comments: Suboptimal parasternal window, suboptimal apical  ?window and patient is morbidly obese. Patient declined Definity use  ?IMPRESSIONS  ? ? 1. Diffuse hypokinesis worse in the septum and apex. Left ventricular  ?ejection fraction, by estimation, is 30 to 35%. The left ventricle has  ?moderately decreased function. The left ventricle demonstrates global  ?hypokinesis. The left ventricular internal  ?cavity size was moderately dilated. Left ventricular diastolic parameters  ?are indeterminate.  ? 2. Right ventricular systolic function is normal. The right ventricular  ?size is normal.  ? 3. The mitral valve is abnormal. Moderate mitral valve regurgitation. No  ?evidence of mitral stenosis.  ? 4. The aortic valve is tricuspid. There is mild calcification of the  ?aortic valve. Aortic valve regurgitation is trivial. Aortic valve  ?sclerosis is present, with no evidence of aortic valve stenosis.  ? 5. The inferior vena cava is normal in size with greater than 50%  ?respiratory variability, suggesting right atrial pressure of 3 mmHg.  ? ?CTA Chest 06/28/2019: ?  FINDINGS: ?Cardiovascular: No filling defects within the pulmonary arteries ?arteries to suggest acute pulmonary embolism. ?  ?Mediastinum/Nodes: No axillary or supraclavicular adenopathy. No ?mediastinal hilar adenopathy. No pericardial effusion. Esophagus ?normal. ?  ?Lungs/Pleura: Mild peripheral linear interstitial thickening. Mild ?peripheral ground-glass opacities in the LEFT upper lobe and LEFT ?lower lobe. No focal consolidation. No discrete airspace disease. ?Airways normal. No pulmonary infarction. ?  ?Upper Abdomen: Limited view of the liver,  kidneys, pancreas are ?unremarkable. Normal adrenal glands. ?  ?Musculoskeletal: No aggressive osseous lesion. ?  ?Review of the MIP images confirms the above findings. ?  ?IMPRESSION: ?1. Peripheral interstitial thi

## 2021-07-10 ENCOUNTER — Encounter (HOSPITAL_BASED_OUTPATIENT_CLINIC_OR_DEPARTMENT_OTHER): Payer: Self-pay | Admitting: Cardiology

## 2021-07-10 ENCOUNTER — Other Ambulatory Visit: Payer: Self-pay

## 2021-07-10 ENCOUNTER — Ambulatory Visit (HOSPITAL_BASED_OUTPATIENT_CLINIC_OR_DEPARTMENT_OTHER): Payer: Medicare HMO | Admitting: Cardiology

## 2021-07-10 VITALS — BP 132/72 | HR 98 | Ht 60.0 in | Wt 157.0 lb

## 2021-07-10 DIAGNOSIS — I447 Left bundle-branch block, unspecified: Secondary | ICD-10-CM | POA: Diagnosis not present

## 2021-07-10 DIAGNOSIS — I700 Atherosclerosis of aorta: Secondary | ICD-10-CM

## 2021-07-10 DIAGNOSIS — I420 Dilated cardiomyopathy: Secondary | ICD-10-CM

## 2021-07-10 DIAGNOSIS — Z712 Person consulting for explanation of examination or test findings: Secondary | ICD-10-CM

## 2021-07-10 DIAGNOSIS — Z79899 Other long term (current) drug therapy: Secondary | ICD-10-CM | POA: Diagnosis not present

## 2021-07-10 DIAGNOSIS — E7800 Pure hypercholesterolemia, unspecified: Secondary | ICD-10-CM | POA: Diagnosis not present

## 2021-07-10 DIAGNOSIS — Z8616 Personal history of COVID-19: Secondary | ICD-10-CM | POA: Diagnosis not present

## 2021-07-10 DIAGNOSIS — R0609 Other forms of dyspnea: Secondary | ICD-10-CM

## 2021-07-10 MED ORDER — ENTRESTO 24-26 MG PO TABS: 1.0000 | ORAL_TABLET | Freq: Two times a day (BID) | ORAL | 11 refills | Status: DC

## 2021-07-10 NOTE — Patient Instructions (Signed)
Medication Instructions:  ?I would start with 1/2 pill of the entresto twice a day for two weeks. Once you are doing well, then I would increase to 1 pill twice a day. Please get your kidney function checked about a week after you are on the regular dose (1 pill twice a day) of the entresto. ? ?*If you need a refill on your cardiac medications before your next appointment, please call your pharmacy* ? ? ?Lab Work: ?Your provider has recommended lab work in 3 weeks (BMP). Please have this collected at [facility] [place]. The lab is open 8:00 am - 4:30 pm. Please avoid 12:00p - 1:00p for lunch hour. You do not need an appointment. Please go to [address] [place],  [phone]. This is in the Primary Care office on the 3rd floor, let them know you are there for blood work and they will direct you to the lab. ? ?If you have labs (blood work) drawn today and your tests are completely normal, you will receive your results only by: ?MyChart Message (if you have MyChart) OR ?A paper copy in the mail ?If you have any lab test that is abnormal or we need to change your treatment, we will call you to review the results. ? ? ?Testing/Procedures: ?Your physician has requested that you have an echocardiogram in June, 2023. Echocardiography is a painless test that uses sound waves to create images of your heart. It provides your doctor with information about the size and shape of your heart and how well your heart?s chambers and valves are working. This procedure takes approximately one hour. There are no restrictions for this procedure. ?[place] ? ? ? ?Follow-Up: ?At [facility], you and your health needs are our priority.  As part of our continuing mission to provide you with exceptional heart care, we have created designated Provider Care Teams.  These Care Teams include your primary Cardiologist (physician) and Advanced Practice Providers (APPs -  Physician  Assistants and Nurse Practitioners) who all work together to provide you with the care you need, when you need it. ? ?We recommend signing up for the patient portal called "MyChart".  Sign up information is provided on this After Visit Summary.  MyChart is used to connect with patients for Virtual Visits (Telemedicine).  Patients are able to view lab/test results, encounter notes, upcoming appointments, etc.  Non-urgent messages can be sent to your provider as well.   ?To learn more about what you can do with MyChart, go to [email or web address].   ? ?Your next appointment:   ?3 month(s) ? ?The format for your next appointment:   ?In Person ? ?Provider:   ?Buford Dresser, MD{ ? ? ?

## 2021-08-08 DIAGNOSIS — Z79899 Other long term (current) drug therapy: Secondary | ICD-10-CM | POA: Diagnosis not present

## 2021-08-09 LAB — HEPATIC FUNCTION PANEL
ALT: 14 IU/L (ref 0–32)
AST: 15 IU/L (ref 0–40)
Albumin: 4.5 g/dL (ref 3.8–4.8)
Alkaline Phosphatase: 93 IU/L (ref 44–121)
Bilirubin Total: 0.5 mg/dL (ref 0.0–1.2)
Bilirubin, Direct: 0.17 mg/dL (ref 0.00–0.40)
Total Protein: 6.7 g/dL (ref 6.0–8.5)

## 2021-08-09 LAB — LIPID PANEL
Chol/HDL Ratio: 2.4 ratio (ref 0.0–4.4)
Cholesterol, Total: 152 mg/dL (ref 100–199)
HDL: 64 mg/dL (ref >39)
LDL Chol Calc (NIH): 75 mg/dL (ref 0–99)
Triglycerides: 63 mg/dL (ref 0–149)
VLDL Cholesterol Cal: 13 mg/dL (ref 5–40)

## 2021-08-16 ENCOUNTER — Telehealth (HOSPITAL_BASED_OUTPATIENT_CLINIC_OR_DEPARTMENT_OTHER): Payer: Self-pay

## 2021-08-16 NOTE — Telephone Encounter (Addendum)
Results called to patient who verbalizes understanding!  ? ? ? ?----- Message from Buford Dresser, MD sent at 08/15/2021  9:44 AM EDT ----- ?Liver and kidney function normal. Cholesterol much improved (LDL from 120 to 75). Since there was no blockage in the heart vessels (but some plaque in the aorta) this is a reasonable number. Continue current meds. ?

## 2021-08-28 DIAGNOSIS — Z01419 Encounter for gynecological examination (general) (routine) without abnormal findings: Secondary | ICD-10-CM | POA: Diagnosis not present

## 2021-08-28 DIAGNOSIS — Z6831 Body mass index (BMI) 31.0-31.9, adult: Secondary | ICD-10-CM | POA: Diagnosis not present

## 2021-08-28 DIAGNOSIS — N958 Other specified menopausal and perimenopausal disorders: Secondary | ICD-10-CM | POA: Diagnosis not present

## 2021-08-28 DIAGNOSIS — Z1231 Encounter for screening mammogram for malignant neoplasm of breast: Secondary | ICD-10-CM | POA: Diagnosis not present

## 2021-08-28 DIAGNOSIS — M8588 Other specified disorders of bone density and structure, other site: Secondary | ICD-10-CM | POA: Diagnosis not present

## 2021-09-23 ENCOUNTER — Ambulatory Visit (INDEPENDENT_AMBULATORY_CARE_PROVIDER_SITE_OTHER): Payer: Medicare HMO

## 2021-09-23 DIAGNOSIS — I420 Dilated cardiomyopathy: Secondary | ICD-10-CM

## 2021-09-23 LAB — ECHOCARDIOGRAM COMPLETE
AR max vel: 1.83 cm2
AV Area VTI: 1.68 cm2
AV Area mean vel: 1.73 cm2
AV Mean grad: 4 mmHg
AV Peak grad: 7.4 mmHg
Ao pk vel: 1.36 m/s
Area-P 1/2: 8.46 cm2
Calc EF: 43.4 %
MV M vel: 4.91 m/s
MV Peak grad: 96.4 mmHg
Radius: 0.5 cm
S' Lateral: 2.5 cm
Single Plane A2C EF: 52.2 %
Single Plane A4C EF: 30.4 %

## 2021-09-30 ENCOUNTER — Ambulatory Visit (HOSPITAL_BASED_OUTPATIENT_CLINIC_OR_DEPARTMENT_OTHER): Payer: Medicare HMO | Admitting: Cardiology

## 2021-09-30 VITALS — BP 146/89 | HR 79 | Ht 60.0 in | Wt 157.2 lb

## 2021-09-30 DIAGNOSIS — R0609 Other forms of dyspnea: Secondary | ICD-10-CM

## 2021-09-30 DIAGNOSIS — I420 Dilated cardiomyopathy: Secondary | ICD-10-CM | POA: Diagnosis not present

## 2021-09-30 DIAGNOSIS — I447 Left bundle-branch block, unspecified: Secondary | ICD-10-CM | POA: Diagnosis not present

## 2021-09-30 DIAGNOSIS — E7800 Pure hypercholesterolemia, unspecified: Secondary | ICD-10-CM | POA: Diagnosis not present

## 2021-09-30 DIAGNOSIS — Z8616 Personal history of COVID-19: Secondary | ICD-10-CM | POA: Diagnosis not present

## 2021-09-30 DIAGNOSIS — I700 Atherosclerosis of aorta: Secondary | ICD-10-CM | POA: Diagnosis not present

## 2021-09-30 NOTE — Progress Notes (Signed)
Cardiology Office Note:    Date:  10/02/2021   ID:  [name], [name] Feb 29, 1956, MRN [id number]  PCP:  Kathyrn Lass, MD  Cardiologist:  Buford Dresser, MD  Referring MD: Kathyrn Lass, MD   CC: Follow-up   History of Present Illness:    Lauren Peters is a 66 y.o. female with a hx of nonischemic cardiomyopathy, acid reflux, nephrolithiasis, and depression, who is seen for follow-up. I initially met her 05/20/2021 as a new consult at the request of Kathyrn Lass, MD for the evaluation and management of persistent shortness of breath post COVID.  Cardiac history: Initial Covid infestion 04/2019, second infection 09/2020, with persistent cough and dyspnea on exertion. Note from Jillyn Ledger from 03/21/21 noted history of aortic atherosclerosis and recommended consideration of a statin.   Saw Dr. Vaughan Browner once after her Covid hospitalization. CTA 06/28/2019-no pulmonary embolism, peripheral interstitial thickening, mild groundglass in alternate pattern.  Comorbid conditions: Hypertension - 20 years ago she was started on antihypertensives (discontinued 2 months later due to cough and low blood pressures). Since her antihypertensives were discontinued she did not begin any subsequent therapy, and her high blood pressure was thought to be due to white coat syndrome.  Tobacco use history: Former smoker, quit about 40 years ago. Alcohol: Limited to weekends. 3-4 drinks at most. Family history: Her grandparents both had strokes. Her mother had a heart attack about 5 years ago, and is a patient of Dr. Oval Linsey. Her father had Parkinson's.   At her last appointment she complained of an intermittent cough with dyspnea on exertion. She was tolerating 25 mg metoprolol.  Today: She reports no improvement in her cough, and no relief by any treatments. Currently she is taking entresto once daily with her other medications in the morning. It has been difficult for her to remember her evening dose of entresto,  so she has stopped taking that dose.  Previously when transitioning from 50 mg to 25 mg metoprolol, she noticed significant improvement in her fatigue. From 25 mg to 12.5 mg, she did not notice as much of a difference.   For 4-5 days out of the week, she wakes up with nausea, which she attributes to her prior COVID infection.  Typically she will drink alcohol 2-3 times a week, can be several drinks at a time. Reviewed recommendations/guidelines today.  She denies any palpitations, chest pain, or peripheral edema. No lightheadedness, headaches, syncope, orthopnea, or PND.   Past Medical History:  Diagnosis Date   Acid reflux    Anxiety    Depression    History of kidney stones     Past Surgical History:  Procedure Laterality Date   ABDOMINAL HYSTERECTOMY     over 30 years ago   EXTRACORPOREAL SHOCK WAVE LITHOTRIPSY Left 08/23/2018   Procedure: EXTRACORPOREAL SHOCK WAVE LITHOTRIPSY (ESWL);  Surgeon: Franchot Gallo, MD;  Location: [facility];  Service: Urology;  Laterality: Left;   HAND SURGERY Right    x2 on right hand, carpal tunnel, bone removed   LITHOTRIPSY  2007   REDUCTION MAMMAPLASTY     20 years ago   RIGHT/LEFT HEART CATH AND CORONARY ANGIOGRAPHY N/A 07/02/2021   Procedure: RIGHT/LEFT HEART CATH AND CORONARY ANGIOGRAPHY;  Surgeon: Belva Crome, MD;  Location: [facility];  Service: Cardiovascular;  Laterality: N/A;    Current Medications: Current Outpatient Medications on File Prior to Visit  Medication Sig   aspirin EC 81 MG tablet Take 1 tablet (81 mg total) by  mouth daily. Swallow whole.   buPROPion (WELLBUTRIN) 75 MG tablet Take 75 mg by mouth in the morning.   clobetasol ointment (TEMOVATE) 0.05 % Apply 1 application. topically 2 (two) times daily as needed (skin irritation.).   metoprolol succinate (TOPROL XL) 25 MG 24 hr tablet Take 1/2 tablet daily   omeprazole (PRILOSEC) 40 MG capsule Take 40 mg by mouth daily.    rosuvastatin (CRESTOR) 10 MG tablet  Take 1 tablet (10 mg total) by mouth daily.   sacubitril-valsartan (ENTRESTO) 24-26 MG Take 1 tablet by mouth 2 (two) times daily. (Patient taking differently: Take 1 tablet by mouth daily.)   No current facility-administered medications on file prior to visit.     Allergies:   Albuterol and Sulfamethoxazole   Social History   Tobacco Use   Smoking status: Never   Smokeless tobacco: Never  Vaping Use   Vaping Use: Never used  Substance Use Topics   Alcohol use: Yes    Comment: 2x week   Drug use: Never    Family History: family history includes Breast cancer in an other family member.  ROS:   Please see the history of present illness. (+) Cough (+) Nausea All other systems are reviewed and negative.    EKGs/Labs/Other Studies Reviewed:    The following studies were reviewed today:  Echo  09/23/2021:  1. Left ventricular ejection fraction, by estimation, is 35 to 40%. The  left ventricle has moderately decreased function. The left ventricle  demonstrates global hypokinesis. There is mild concentric left ventricular  hypertrophy. Indeterminate diastolic  filling due to E-A fusion. The average left ventricular global  longitudinal strain is -10.1 %. The global longitudinal strain is  abnormal.   2. Right ventricular systolic function is normal. The right ventricular  size is normal. There is normal pulmonary artery systolic pressure.   3. The mitral valve is normal in structure. Trivial mitral valve  regurgitation. No evidence of mitral stenosis.   4. The aortic valve is tricuspid. Aortic valve regurgitation is trivial.  No aortic stenosis is present.   5. The inferior vena cava is normal in size with greater than 50%  respiratory variability, suggesting right atrial pressure of 3 mmHg.   Right/Left Heart Cath 07/02/2021: CONCLUSIONS: Normal coronary arteries with right dominant anatomy. LVEDP, 8 mmHg. Mean pulmonary artery pressure 21 mmHg, which based upon updated  criteria is mild pulmonary hypertension.  Pulmonary wedge pressure mean 7 mmHg. Fick cardiac output 5 L/min.   RECOMMENDATIONS: Guideline directed therapy for systolic dysfunction as tolerated by blood pressure. QRST is 136 ms.  Resynchronization would be a consideration if medical therapy does not elevate the ejection fraction, however QRS duration is less than the 150 ms threshold that has the best response  Echo 05/28/2021: Sonographer Comments: Suboptimal parasternal window, suboptimal apical  window and patient is morbidly obese. Patient declined Definity use  IMPRESSIONS    1. Diffuse hypokinesis worse in the septum and apex. Left ventricular  ejection fraction, by estimation, is 30 to 35%. The left ventricle has  moderately decreased function. The left ventricle demonstrates global  hypokinesis. The left ventricular internal  cavity size was moderately dilated. Left ventricular diastolic parameters  are indeterminate.   2. Right ventricular systolic function is normal. The right ventricular  size is normal.   3. The mitral valve is abnormal. Moderate mitral valve regurgitation. No  evidence of mitral stenosis.   4. The aortic valve is tricuspid. There is mild calcification of the  aortic valve. Aortic valve regurgitation is trivial. Aortic valve  sclerosis is present, with no evidence of aortic valve stenosis.   5. The inferior vena cava is normal in size with greater than 50%  respiratory variability, suggesting right atrial pressure of 3 mmHg.   CTA Chest 06/28/2019: FINDINGS: Cardiovascular: No filling defects within the pulmonary arteries arteries to suggest acute pulmonary embolism.   Mediastinum/Nodes: No axillary or supraclavicular adenopathy. No mediastinal hilar adenopathy. No pericardial effusion. Esophagus normal.   Lungs/Pleura: Mild peripheral linear interstitial thickening. Mild peripheral ground-glass opacities in the LEFT upper lobe and LEFT lower lobe. No  focal consolidation. No discrete airspace disease. Airways normal. No pulmonary infarction.   Upper Abdomen: Limited view of the liver, kidneys, pancreas are unremarkable. Normal adrenal glands.   Musculoskeletal: No aggressive osseous lesion.   Review of the MIP images confirms the above findings.   IMPRESSION: 1. Peripheral interstitial thickening and mild ground-glass opacities in lungs are sequelae of resolving COVID infection. No focal consolidation. 2. No evidence acute pulmonary embolism.  RLE Venous Doppler 07/17/2016:  Summary:   - No evidence of deep vein thrombosis involving the visualized    veins of the right lower extremity.  - No evidence of Baker&'s cyst in the popliteal fossa although there    does appear to be a small pocket of fluid near the gastronemius    muscle in the proximal calf, possible ruptured cyst, 4.4 x 3.9cm?   EKG:  EKG is personally reviewed.   09/30/2021:  EKG was not ordered. 07/10/2021: EKG was not ordered. 06/17/2021: NSR at 90 bpm, LBBB QRS 136 ms 05/20/2021: NSR at 87 bpm, IVCB  Recent Labs: 06/18/2021: BUN 14; Creatinine, Ser 0.96; Platelets 277; TSH 1.750 07/02/2021: Hemoglobin 12.9; Potassium 3.9; Sodium 140 08/08/2021: ALT 14   Recent Lipid Panel    Component Value Date/Time   CHOL 152 08/08/2021 1042   TRIG 63 08/08/2021 1042   HDL 64 08/08/2021 1042   CHOLHDL 2.4 08/08/2021 1042   LDLCALC 75 08/08/2021 1042    Physical Exam:    VS:  BP (!) 146/89 (BP Location: Right Arm, Patient Position: Sitting, Cuff Size: Normal)   Pulse 79   Ht 5' (1.524 m)   Wt 157 lb 3.2 oz (71.3 kg)   SpO2 97%   BMI 30.70 kg/m     Wt Readings from Last 3 Encounters:  09/30/21 157 lb 3.2 oz (71.3 kg)  07/10/21 157 lb (71.2 kg)  07/02/21 155 lb (70.3 kg)    GEN: Well nourished, well developed in no acute distress HEENT: Normal, moist mucous membranes NECK: No JVD CARDIAC: regular rhythm, normal S1 and S2, no rubs or gallops. No murmur. VASCULAR:  Radial and DP pulses 2+ bilaterally. No carotid bruits RESPIRATORY:  Clear to auscultation without rales, wheezing or rhonchi  ABDOMEN: Soft, non-tender, non-distended MUSCULOSKELETAL:  Ambulates independently SKIN: Warm and dry, no edema NEUROLOGIC:  Alert and oriented x 3. No focal neuro deficits noted. PSYCHIATRIC:  Normal affect    ASSESSMENT:    1. Dilated cardiomyopathy ([place])   2. Dyspnea on exertion   3. LBBB (left bundle branch block)   4. Personal history of COVID-19   5. Aortic atherosclerosis ([facility])   6. Pure hypercholesterolemia     PLAN:    Dyspnea on exertion History of Covid-19 Dilated cardiomyopathy, nonischemic based on cath LBBB -did not tolerate metoprolol succinate 50 mg dose, is tolerating 12.5 mg daily dose. We discussed increasing to 12.5 mg BID, she  will trial this -discussed ideal entresto dosing is BID. She will work to remember this, and adding metoprolol in the PM too may help with routine -filling pressures very good. Based on this, I do not think all of her cough/shortness of breath is due to her heart. -echo after 3 mos GDMT shows EF 35-40%. Prior was 30-35% -We discussed nonischemic cardiomyopathy at length today. Discussed medical management optimization, see above -appears euvolemic, NYHA class 2  Aortic atherosclerosis Hypercholesterolemia -tolerating rosuvastatin 10 mg daily, aspirin 81 mg daily. Started 05/2021 -per [facility] 04/22/21: Tchol 193, HDL 61, LDL 120, TG 67 -no CAD on cath  Cardiac risk counseling and prevention recommendations: -recommend heart healthy/Mediterranean diet, with whole grains, fruits, vegetable, fish, lean meats, nuts, and olive oil. Limit salt. -recommend moderate walking, 3-5 times/week for 30-50 minutes each session. Aim for at least 150 minutes.week. Goal should be pace of 3 miles/hours, or walking 1.5 miles in 30 minutes -recommend avoidance of tobacco products. Avoid excess alcohol.   Plan for follow up: 6 months or  sooner as needed.  Buford Dresser, MD, PhD, [facility]    Medication Adjustments/Labs and Tests Ordered: Current medicines are reviewed at length with the patient today.  Concerns regarding medicines are outlined above.   No orders of the defined types were placed in this encounter.  No orders of the defined types were placed in this encounter.  Patient Instructions  Medication Instructions:  Try to take the entresto twice a day. And try to take 1/2 pill metoprolol twice a day as well. These will both help your heart.  *If you need a refill on your cardiac medications before your next appointment, please call your pharmacy*   Lab Work: None ordered today   Testing/Procedures: None ordered today   Follow-Up: At [facility], you and your health needs are our priority.  As part of our continuing mission to provide you with exceptional heart care, we have created designated Provider Care Teams.  These Care Teams include your primary Cardiologist (physician) and Advanced Practice Providers (APPs -  Physician Assistants and Nurse Practitioners) who all work together to provide you with the care you need, when you need it.  We recommend signing up for the patient portal called "MyChart".  Sign up information is provided on this After Visit Summary.  MyChart is used to connect with patients for Virtual Visits (Telemedicine).  Patients are able to view lab/test results, encounter notes, upcoming appointments, etc.  Non-urgent messages can be sent to your provider as well.   To learn more about what you can do with MyChart, go to [email or web address].    Your next appointment:   6 month(s)  The format for your next appointment:   In Person  Provider:   Buford Dresser, MD      [facility],acting as a scribe for Buford Dresser, MD.,have documented all relevant documentation on the behalf of Buford Dresser, MD,as directed by   Buford Dresser, MD while in the presence of Buford Dresser, MD.  I, Buford Dresser, MD, have reviewed all documentation for this visit. The documentation on 10/02/21 for the exam, diagnosis, procedures, and orders are all accurate and complete.   Signed, Buford Dresser, MD PhD 10/02/2021 10:55 AM    [place]

## 2021-09-30 NOTE — Patient Instructions (Addendum)
Medication Instructions:  Try to take the entresto twice a day. And try to take 1/2 pill metoprolol twice a day as well. These will both help your heart.  *If you need a refill on your cardiac medications before your next appointment, please call your pharmacy*   Lab Work: None ordered today   Testing/Procedures: None ordered today   Follow-Up: At [facility], you and your health needs are our priority.  As part of our continuing mission to provide you with exceptional heart care, we have created designated Provider Care Teams.  These Care Teams include your primary Cardiologist (physician) and Advanced Practice Providers (APPs -  Physician Assistants and Nurse Practitioners) who all work together to provide you with the care you need, when you need it.  We recommend signing up for the patient portal called "MyChart".  Sign up information is provided on this After Visit Summary.  MyChart is used to connect with patients for Virtual Visits (Telemedicine).  Patients are able to view lab/test results, encounter notes, upcoming appointments, etc.  Non-urgent messages can be sent to your provider as well.   To learn more about what you can do with MyChart, go to [email or web address].    Your next appointment:   6 month(s)  The format for your next appointment:   In Person  Provider:   Buford Dresser, MD

## 2021-10-02 ENCOUNTER — Encounter (HOSPITAL_BASED_OUTPATIENT_CLINIC_OR_DEPARTMENT_OTHER): Payer: Self-pay | Admitting: Cardiology

## 2021-12-06 DIAGNOSIS — J019 Acute sinusitis, unspecified: Secondary | ICD-10-CM | POA: Diagnosis not present

## 2021-12-06 DIAGNOSIS — R300 Dysuria: Secondary | ICD-10-CM | POA: Diagnosis not present

## 2021-12-06 DIAGNOSIS — N3000 Acute cystitis without hematuria: Secondary | ICD-10-CM | POA: Diagnosis not present

## 2021-12-06 DIAGNOSIS — J40 Bronchitis, not specified as acute or chronic: Secondary | ICD-10-CM | POA: Diagnosis not present

## 2021-12-17 DIAGNOSIS — Z6831 Body mass index (BMI) 31.0-31.9, adult: Secondary | ICD-10-CM | POA: Diagnosis not present

## 2021-12-17 DIAGNOSIS — Z882 Allergy status to sulfonamides status: Secondary | ICD-10-CM | POA: Diagnosis not present

## 2021-12-17 DIAGNOSIS — Z87891 Personal history of nicotine dependence: Secondary | ICD-10-CM | POA: Diagnosis not present

## 2021-12-17 DIAGNOSIS — I509 Heart failure, unspecified: Secondary | ICD-10-CM | POA: Diagnosis not present

## 2021-12-17 DIAGNOSIS — Z8249 Family history of ischemic heart disease and other diseases of the circulatory system: Secondary | ICD-10-CM | POA: Diagnosis not present

## 2021-12-17 DIAGNOSIS — K219 Gastro-esophageal reflux disease without esophagitis: Secondary | ICD-10-CM | POA: Diagnosis not present

## 2021-12-17 DIAGNOSIS — E785 Hyperlipidemia, unspecified: Secondary | ICD-10-CM | POA: Diagnosis not present

## 2021-12-17 DIAGNOSIS — R69 Illness, unspecified: Secondary | ICD-10-CM | POA: Diagnosis not present

## 2021-12-17 DIAGNOSIS — I110 Hypertensive heart disease with heart failure: Secondary | ICD-10-CM | POA: Diagnosis not present

## 2021-12-17 DIAGNOSIS — E261 Secondary hyperaldosteronism: Secondary | ICD-10-CM | POA: Diagnosis not present

## 2021-12-17 DIAGNOSIS — E669 Obesity, unspecified: Secondary | ICD-10-CM | POA: Diagnosis not present

## 2022-01-16 ENCOUNTER — Telehealth: Payer: Self-pay

## 2022-01-16 NOTE — Patient Outreach (Signed)
  Care Coordination   01/16/2022 Name: Lauren Peters MRN: [id number] DOB: Oct 16, 1955   Care Coordination Outreach Attempts:  An unsuccessful telephone outreach was attempted today to offer the patient information about available care coordination services as a benefit of their health plan.   Follow Up Plan:  Additional outreach attempts will be made to offer the patient care coordination information and services.   Encounter Outcome:  No Answer  Care Coordination Interventions Activated:  No   Care Coordination Interventions:  No, not indicated    [facility] [phone]

## 2022-01-23 ENCOUNTER — Telehealth: Payer: Self-pay

## 2022-01-23 DIAGNOSIS — H5213 Myopia, bilateral: Secondary | ICD-10-CM | POA: Diagnosis not present

## 2022-01-23 DIAGNOSIS — H43813 Vitreous degeneration, bilateral: Secondary | ICD-10-CM | POA: Diagnosis not present

## 2022-01-23 DIAGNOSIS — H2513 Age-related nuclear cataract, bilateral: Secondary | ICD-10-CM | POA: Diagnosis not present

## 2022-01-23 DIAGNOSIS — H524 Presbyopia: Secondary | ICD-10-CM | POA: Diagnosis not present

## 2022-01-23 DIAGNOSIS — H52222 Regular astigmatism, left eye: Secondary | ICD-10-CM | POA: Diagnosis not present

## 2022-01-23 NOTE — Patient Outreach (Signed)
  Care Coordination   01/23/2022 Name: REHAM SLABAUGH MRN: [id number] DOB: 03/19/1956   Care Coordination Outreach Attempts:  A second unsuccessful outreach was attempted today to offer the patient with information about available care coordination services as a benefit of their health plan.     Follow Up Plan:  Additional outreach attempts will be made to offer the patient care coordination information and services.   Encounter Outcome:  No Answer  Care Coordination Interventions Activated:  No   Care Coordination Interventions:  No, not indicated    [facility] [phone]

## 2022-02-12 DIAGNOSIS — M8580 Other specified disorders of bone density and structure, unspecified site: Secondary | ICD-10-CM | POA: Diagnosis not present

## 2022-03-18 ENCOUNTER — Ambulatory Visit (HOSPITAL_BASED_OUTPATIENT_CLINIC_OR_DEPARTMENT_OTHER): Payer: Medicare HMO | Admitting: Cardiology

## 2022-03-18 ENCOUNTER — Encounter (HOSPITAL_BASED_OUTPATIENT_CLINIC_OR_DEPARTMENT_OTHER): Payer: Self-pay | Admitting: Cardiology

## 2022-03-18 VITALS — BP 142/96 | HR 87 | Ht 60.0 in | Wt 164.0 lb

## 2022-03-18 DIAGNOSIS — I5042 Chronic combined systolic (congestive) and diastolic (congestive) heart failure: Secondary | ICD-10-CM

## 2022-03-18 DIAGNOSIS — I700 Atherosclerosis of aorta: Secondary | ICD-10-CM

## 2022-03-18 DIAGNOSIS — I447 Left bundle-branch block, unspecified: Secondary | ICD-10-CM

## 2022-03-18 DIAGNOSIS — R0609 Other forms of dyspnea: Secondary | ICD-10-CM

## 2022-03-18 DIAGNOSIS — E7800 Pure hypercholesterolemia, unspecified: Secondary | ICD-10-CM

## 2022-03-18 DIAGNOSIS — Z8616 Personal history of COVID-19: Secondary | ICD-10-CM

## 2022-03-18 DIAGNOSIS — I420 Dilated cardiomyopathy: Secondary | ICD-10-CM

## 2022-03-18 MED ORDER — EMPAGLIFLOZIN 10 MG PO TABS: 10.0000 mg | ORAL_TABLET | Freq: Every day | ORAL | 3 refills | Status: DC

## 2022-03-18 NOTE — Patient Instructions (Signed)
Medication Instructions:  Your physician has recommended you make the following change in your medication:   Start: Jardiance '10mg'$  daily   We have sent you with samples today to ensure that you tolerate the Jardiance. Please let Daphene Jaeger know when you have your new insurance cards and we will handle the prior auth.  *If you need a refill on your cardiac medications before your next appointment, please call your pharmacy*   Lab Work: Please return for Lab work the first week of January for BMP. You may come to the...   [facility] (3rd floor) [address] [place], [place] [address]  Open: 8am-Noon and 1pm-4:30pm  Please ring the doorbell on the small table when you exit the elevator and the Lab Tech will come get you  [facility] [address] [place], [place], [place] [address] Open: 8am-1pm, then 2pm-4:30pm   [place]- Please see attached locations sheet stapled to your lab work with address and hours.   If you have labs (blood work) drawn today and your tests are completely normal, you will receive your results only by: [place] (if you have MyChart) OR A paper copy in the mail If you have any lab test that is abnormal or we need to change your treatment, we will call you to review the results.  Follow-Up: At [facility], you and your health needs are our priority.  As part of our continuing mission to provide you with exceptional heart care, we have created designated Provider Care Teams.  These Care Teams include your primary Cardiologist (physician) and Advanced Practice Providers (APPs -  Physician Assistants and Nurse Practitioners) who all work together to provide you with the care you need, when you need it.  We recommend signing up for the patient portal called "MyChart".  Sign up information is provided on this After Visit Summary.  MyChart is used to connect with patients for Virtual Visits  (Telemedicine).  Patients are able to view lab/test results, encounter notes, upcoming appointments, etc.  Non-urgent messages can be sent to your provider as well.   To learn more about what you can do with MyChart, go to [email or web address].    Your next appointment:   3 month(s)  The format for your next appointment:   In Person  Provider:   Buford Dresser, MD

## 2022-03-18 NOTE — Progress Notes (Signed)
Cardiology Office Note:    Date:  03/18/2022   ID:  Lauren, Peters 07-Oct-1955, MRN [id number]  PCP:  Kathyrn Lass, MD  Cardiologist:  Buford Dresser, MD  Referring MD: Kathyrn Lass, MD   CC: Follow-up   History of Present Illness:    Lauren Peters is a 66 y.o. female with a hx of nonischemic cardiomyopathy, acid reflux, nephrolithiasis, and depression, who is seen for follow-up. I initially met her 05/20/2021 as a new consult at the request of Kathyrn Lass, MD for the evaluation and management of persistent shortness of breath post COVID.  Cardiac history: Initial Covid infection 04/2019, second infection 09/2020, with persistent cough and dyspnea on exertion. Note from Jillyn Ledger from 03/21/21 noted history of aortic atherosclerosis and recommended consideration of a statin.   Saw Dr. Vaughan Browner once after her Covid hospitalization. CTA 06/28/2019-no pulmonary embolism, peripheral interstitial thickening, mild groundglass in alternate pattern.  Comorbid conditions: Hypertension - 20 years ago she was started on antihypertensives (discontinued 2 months later due to cough and low blood pressures). Since her antihypertensives were discontinued she did not begin any subsequent therapy, and her high blood pressure was thought to be due to white coat syndrome.  Tobacco use history: Former smoker, quit about 40 years ago. Alcohol: Limited to weekends. 3-4 drinks at most. Family history: Her grandparents both had strokes. Her mother had a heart attack about 5 years ago, and is a patient of Dr. Oval Linsey. Her father had Parkinson's.   At her last visit on 01/09/2022 she reported that she had no improvement in her cough and was not able to get any relief by any treatments. She was taking entresto once daily in the morning with her other medications, she has had some difficulty remembering the evening dose so she has stopped taking it. Her fatigue had improved when she transitioned from 44m to 238m metoprolol. She had been experiencing nausea in the morning 4-5 days out of the week, which she attributed to her past COVID infection. She would have a alcoholic drink about 2-3 times a week, sometimes several drinks at a time.   Today, she reports that she has been doing well overall but she is still experiencing SOB and cough that she has had since her COVID infection almost 4 years ago.   She also expressed frustration with the amount of medication that she is taking. Currently she has been tolerating them well, but she has not been used to taking so many medications. We reviewed her medications and indications for them today.  She denies any palpitations, chest pain, or peripheral edema. No lightheadedness, headaches, syncope, orthopnea, or PND.   Past Medical History:  Diagnosis Date   Acid reflux    Anxiety    Depression    History of kidney stones     Past Surgical History:  Procedure Laterality Date   ABDOMINAL HYSTERECTOMY     over 30 years ago   EXTRACORPOREAL SHOCK WAVE LITHOTRIPSY Left 08/23/2018   Procedure: EXTRACORPOREAL SHOCK WAVE LITHOTRIPSY (ESWL);  Surgeon: DaFranchot GalloMD;  Location: [facility];  Service: Urology;  Laterality: Left;   HAND SURGERY Right    x2 on right hand, carpal tunnel, bone removed   LITHOTRIPSY  2007   REDUCTION MAMMAPLASTY     20 years ago   RIGHT/LEFT HEART CATH AND CORONARY ANGIOGRAPHY N/A 07/02/2021   Procedure: RIGHT/LEFT HEART CATH AND CORONARY ANGIOGRAPHY;  Surgeon: SmBelva CromeMD;  [facility];  Service: Cardiovascular;  Laterality: N/A;    Current Medications: Current Outpatient Medications on File Prior to Visit  Medication Sig   aspirin EC 81 MG tablet Take 1 tablet (81 mg total) by mouth daily. Swallow whole.   buPROPion (WELLBUTRIN) 75 MG tablet Take 75 mg by mouth in the morning.   clobetasol ointment (TEMOVATE) 8.00 % Apply 1 application. topically 2 (two) times daily as needed (skin irritation.).    metoprolol succinate (TOPROL XL) 25 MG 24 hr tablet Take 1/2 tablet daily   omeprazole (PRILOSEC) 40 MG capsule Take 40 mg by mouth daily.    rosuvastatin (CRESTOR) 10 MG tablet Take 1 tablet (10 mg total) by mouth daily.   sacubitril-valsartan (ENTRESTO) 24-26 MG Take 1 tablet by mouth 2 (two) times daily. (Patient taking differently: Take 1 tablet by mouth daily.)   No current facility-administered medications on file prior to visit.     Allergies:   Albuterol and Sulfamethoxazole   Social History   Tobacco Use   Smoking status: Never   Smokeless tobacco: Never  Vaping Use   Vaping Use: Never used  Substance Use Topics   Alcohol use: Yes    Comment: 2x week   Drug use: Never    Family History: family history includes Breast cancer in an other family member.  ROS:   Please see the history of present illness. (+) shortness of breath (+) cough All other systems are reviewed and negative.    EKGs/Labs/Other Studies Reviewed:    The following studies were reviewed today:  Echo  09/23/2021:  1. Left ventricular ejection fraction, by estimation, is 35 to 40%. The  left ventricle has moderately decreased function. The left ventricle  demonstrates global hypokinesis. There is mild concentric left ventricular  hypertrophy. Indeterminate diastolic  filling due to E-A fusion. The average left ventricular global  longitudinal strain is -10.1 %. The global longitudinal strain is  abnormal.   2. Right ventricular systolic function is normal. The right ventricular  size is normal. There is normal pulmonary artery systolic pressure.   3. The mitral valve is normal in structure. Trivial mitral valve  regurgitation. No evidence of mitral stenosis.   4. The aortic valve is tricuspid. Aortic valve regurgitation is trivial.  No aortic stenosis is present.   5. The inferior vena cava is normal in size with greater than 50%  respiratory variability, suggesting right atrial pressure of 3  mmHg.   Right/Left Heart Cath 07/02/2021: CONCLUSIONS: Normal coronary arteries with right dominant anatomy. LVEDP, 8 mmHg. Mean pulmonary artery pressure 21 mmHg, which based upon updated criteria is mild pulmonary hypertension.  Pulmonary wedge pressure mean 7 mmHg. Fick cardiac output 5 L/min.   RECOMMENDATIONS: Guideline directed therapy for systolic dysfunction as tolerated by blood pressure. QRST is 136 ms.  Resynchronization would be a consideration if medical therapy does not elevate the ejection fraction, however QRS duration is less than the 150 ms threshold that has the best response  Echo 05/28/2021: Sonographer Comments: Suboptimal parasternal window, suboptimal apical  window and patient is morbidly obese. Patient declined Definity use  IMPRESSIONS    1. Diffuse hypokinesis worse in the septum and apex. Left ventricular  ejection fraction, by estimation, is 30 to 35%. The left ventricle has  moderately decreased function. The left ventricle demonstrates global  hypokinesis. The left ventricular internal  cavity size was moderately dilated. Left ventricular diastolic parameters  are indeterminate.   2. Right ventricular systolic function is normal. The right ventricular  size is normal.   3. The mitral valve is abnormal. Moderate mitral valve regurgitation. No  evidence of mitral stenosis.   4. The aortic valve is tricuspid. There is mild calcification of the  aortic valve. Aortic valve regurgitation is trivial. Aortic valve  sclerosis is present, with no evidence of aortic valve stenosis.   5. The inferior vena cava is normal in size with greater than 50%  respiratory variability, suggesting right atrial pressure of 3 mmHg.   CTA Chest 06/28/2019: FINDINGS: Cardiovascular: No filling defects within the pulmonary arteries arteries to suggest acute pulmonary embolism.   Mediastinum/Nodes: No axillary or supraclavicular adenopathy. No mediastinal hilar adenopathy. No  pericardial effusion. Esophagus normal.   Lungs/Pleura: Mild peripheral linear interstitial thickening. Mild peripheral ground-glass opacities in the LEFT upper lobe and LEFT lower lobe. No focal consolidation. No discrete airspace disease. Airways normal. No pulmonary infarction.   Upper Abdomen: Limited view of the liver, kidneys, pancreas are unremarkable. Normal adrenal glands.   Musculoskeletal: No aggressive osseous lesion.   Review of the MIP images confirms the above findings.   IMPRESSION: 1. Peripheral interstitial thickening and mild ground-glass opacities in lungs are sequelae of resolving COVID infection. No focal consolidation. 2. No evidence acute pulmonary embolism.  RLE Venous Doppler 07/17/2016:  Summary:   - No evidence of deep vein thrombosis involving the visualized    veins of the right lower extremity.  - No evidence of Baker&'s cyst in the popliteal fossa although there    does appear to be a small pocket of fluid near the gastronemius    muscle in the proximal calf, possible ruptured cyst, 4.4 x 3.9cm?   EKG:  EKG is personally reviewed.   03/18/2022:NSR at 87 bpm, LBBB QRS 138 06/17/2021: NSR at 90 bpm, LBBB QRS 136 ms 05/20/2021: NSR at 87 bpm, IVCB  Recent Labs: 06/18/2021: BUN 14; Creatinine, Ser 0.96; Platelets 277; TSH 1.750 07/02/2021: Hemoglobin 12.9; Potassium 3.9; Sodium 140 08/08/2021: ALT 14   Recent Lipid Panel    Component Value Date/Time   CHOL 152 08/08/2021 1042   TRIG 63 08/08/2021 1042   HDL 64 08/08/2021 1042   CHOLHDL 2.4 08/08/2021 1042   LDLCALC 75 08/08/2021 1042    Physical Exam:    VS:  BP (!) 142/96   Pulse 87   Ht 5' (1.524 m)   Wt 164 lb (74.4 kg)   BMI 32.03 kg/m     Wt Readings from Last 3 Encounters:  03/18/22 164 lb (74.4 kg)  09/30/21 157 lb 3.2 oz (71.3 kg)  07/10/21 157 lb (71.2 kg)    GEN: Well nourished, well developed in no acute distress HEENT: Normal, moist mucous membranes NECK: No JVD CARDIAC:  regular rhythm, normal S1 and S2, no rubs or gallops. No murmur. VASCULAR: Radial and DP pulses 2+ bilaterally. No carotid bruits RESPIRATORY:  Clear to auscultation without rales, wheezing or rhonchi  ABDOMEN: Soft, non-tender, non-distended MUSCULOSKELETAL:  Ambulates independently SKIN: Warm and dry, no edema NEUROLOGIC:  Alert and oriented x 3. No focal neuro deficits noted. PSYCHIATRIC:  Normal affect    ASSESSMENT:    1. Dilated cardiomyopathy ([place])   2. LBBB (left bundle branch block)   3. Dyspnea on exertion   4. Personal history of COVID-19   5. Chronic combined systolic and diastolic heart failure ([place])   6. Aortic atherosclerosis ([place])   7. Pure hypercholesterolemia    PLAN:    Dyspnea on exertion History of Covid-19 Dilated cardiomyopathy, nonischemic  based on cath LBBB -did not tolerate metoprolol succinate 50 mg dose, is tolerating 12.5 mg daily dose -discussed ideal entresto dosing is BID. She will work to remember this -filling pressures very good. Based on this, I do not think most of her cough/shortness of breath is due to her heart. -echo after 3 mos GDMT shows EF 35-40%. Prior was 30-35% -We discussed nonischemic cardiomyopathy at length today. Discussed medical management recommendations. Discussed SGLT2i specifically. Will start Jardiance, recheck BMET in 3 weeks -appears euvolemic, NYHA class 2  Aortic atherosclerosis Hypercholesterolemia -tolerating rosuvastatin 10 mg daily, aspirin 81 mg daily. Started 05/2021 -per [facility] 08/08/21: Tchol 152, HDL 64, LDL 75, TG 63 -no CAD on cath  Cardiac risk counseling and prevention recommendations: -recommend heart healthy/Mediterranean diet, with whole grains, fruits, vegetable, fish, lean meats, nuts, and olive oil. Limit salt. -recommend moderate walking, 3-5 times/week for 30-50 minutes each session. Aim for at least 150 minutes.week. Goal should be pace of 3 miles/hours, or walking 1.5 miles in 30  minutes -recommend avoidance of tobacco products. Avoid excess alcohol.   Plan for follow up: 3 months  Buford Dresser, MD, PhD, [facility]    Medication Adjustments/Labs and Tests Ordered: Current medicines are reviewed at length with the patient today.  Concerns regarding medicines are outlined above.   Orders Placed This Encounter  Procedures   Basic metabolic panel   Meds ordered this encounter  Medications   empagliflozin (JARDIANCE) 10 MG TABS tablet    Sig: Take 1 tablet (10 mg total) by mouth daily before breakfast.    Dispense:  90 tablet    Refill:  3    Please wait to fill until patient calls (given samples)   Patient Instructions  Medication Instructions:  Your physician has recommended you make the following change in your medication:   Start: Jardiance 89m daily   We have sent you with samples today to ensure that you tolerate the Jardiance. Please let KDaphene Jaegerknow when you have your new insurance cards and we will handle the prior auth.  *If you need a refill on your cardiac medications before your next appointment, please call your pharmacy*   Lab Work: Please return for Lab work the first week of January for BMP. You may come to the...   [facility] (3rd floor) [address] [place] [address] Open: 8am-Noon and 1pm-4:30pm  Please ring the doorbell on the small table when you exit the elevator and the Lab Tech will come get you  [facility] [facility] [id number]: 8am-1pm, then 2pm-4:30pm   [place] Please see attached locations sheet stapled to your lab work with address and hours.   If you have labs (blood work) drawn today and your tests are completely normal, you will receive your results only by: [place](if you have MyChart) OR A paper copy in the mail If you have any lab test that is abnormal or we need to  change your treatment, we will call you to review the results.  Follow-Up: [facility] and your health needs are our priority.  As part of our continuing mission to provide you with exceptional heart care, we have created designated Provider Care Teams.  These Care Teams include your primary Cardiologist (physician) and Advanced Practice Providers (APPs -  Physician Assistants and Nurse Practitioners) who all work together to provide you with the  care you need, when you need it.  We recommend signing up for the patient portal called "MyChart".  Sign up information is provided on this After Visit Summary.  MyChart is used to connect with patients for Virtual Visits (Telemedicine).  Patients are able to view lab/test results, encounter notes, upcoming appointments, etc.  Non-urgent messages can be sent to your provider as well.   To learn more about what you can do with MyChart, go to [email or web address].    Your next appointment:   3 month(s)  The format for your next appointment:   In Person  Provider:   Buford Dresser, MD       Burnett Kanaris Ford,acting as a scribe for Buford Dresser, MD.,have documented all relevant documentation on the behalf of Buford Dresser, MD,as directed by  Buford Dresser, MD while in the presence of Buford Dresser, MD.   I, Buford Dresser, MD, have reviewed all documentation for this visit. The documentation on 03/27/22 for the exam, diagnosis, procedures, and orders are all accurate and complete.   Signed, Buford Dresser, MD PhD 03/18/2022     [place]

## 2022-03-27 NOTE — Addendum Note (Signed)
Addended by: Vennie Homans on: 03/27/2022 02:24 PM   Modules accepted: Orders

## 2022-04-11 DIAGNOSIS — J069 Acute upper respiratory infection, unspecified: Secondary | ICD-10-CM | POA: Diagnosis not present

## 2022-04-25 DIAGNOSIS — Z0000 Encounter for general adult medical examination without abnormal findings: Secondary | ICD-10-CM | POA: Diagnosis not present

## 2022-04-25 DIAGNOSIS — E669 Obesity, unspecified: Secondary | ICD-10-CM | POA: Diagnosis not present

## 2022-05-02 DIAGNOSIS — I420 Dilated cardiomyopathy: Secondary | ICD-10-CM | POA: Diagnosis not present

## 2022-05-02 DIAGNOSIS — F325 Major depressive disorder, single episode, in full remission: Secondary | ICD-10-CM | POA: Diagnosis not present

## 2022-05-02 DIAGNOSIS — R413 Other amnesia: Secondary | ICD-10-CM | POA: Diagnosis not present

## 2022-05-02 DIAGNOSIS — K219 Gastro-esophageal reflux disease without esophagitis: Secondary | ICD-10-CM | POA: Diagnosis not present

## 2022-05-02 DIAGNOSIS — I700 Atherosclerosis of aorta: Secondary | ICD-10-CM | POA: Diagnosis not present

## 2022-05-02 DIAGNOSIS — E669 Obesity, unspecified: Secondary | ICD-10-CM | POA: Diagnosis not present

## 2022-05-02 DIAGNOSIS — Z6830 Body mass index (BMI) 30.0-30.9, adult: Secondary | ICD-10-CM | POA: Diagnosis not present

## 2022-05-15 DIAGNOSIS — I429 Cardiomyopathy, unspecified: Secondary | ICD-10-CM | POA: Diagnosis not present

## 2022-05-15 DIAGNOSIS — F32A Depression, unspecified: Secondary | ICD-10-CM | POA: Diagnosis not present

## 2022-05-15 DIAGNOSIS — K219 Gastro-esophageal reflux disease without esophagitis: Secondary | ICD-10-CM | POA: Diagnosis not present

## 2022-05-15 DIAGNOSIS — I10 Essential (primary) hypertension: Secondary | ICD-10-CM | POA: Diagnosis not present

## 2022-05-15 DIAGNOSIS — Z1211 Encounter for screening for malignant neoplasm of colon: Secondary | ICD-10-CM | POA: Diagnosis not present

## 2022-05-21 ENCOUNTER — Other Ambulatory Visit (HOSPITAL_BASED_OUTPATIENT_CLINIC_OR_DEPARTMENT_OTHER): Payer: Self-pay | Admitting: Cardiology

## 2022-05-21 DIAGNOSIS — I700 Atherosclerosis of aorta: Secondary | ICD-10-CM

## 2022-05-21 NOTE — Telephone Encounter (Signed)
Rx(s) sent to pharmacy electronically.  

## 2022-06-04 DIAGNOSIS — M545 Low back pain, unspecified: Secondary | ICD-10-CM | POA: Diagnosis not present

## 2022-06-17 ENCOUNTER — Encounter (HOSPITAL_BASED_OUTPATIENT_CLINIC_OR_DEPARTMENT_OTHER): Payer: Self-pay | Admitting: Cardiology

## 2022-06-17 ENCOUNTER — Telehealth (HOSPITAL_BASED_OUTPATIENT_CLINIC_OR_DEPARTMENT_OTHER): Payer: Self-pay

## 2022-06-17 ENCOUNTER — Ambulatory Visit (INDEPENDENT_AMBULATORY_CARE_PROVIDER_SITE_OTHER): Payer: Medicare HMO | Admitting: Cardiology

## 2022-06-17 VITALS — BP 142/86 | HR 80 | Ht 60.0 in | Wt 155.0 lb

## 2022-06-17 DIAGNOSIS — I700 Atherosclerosis of aorta: Secondary | ICD-10-CM | POA: Diagnosis not present

## 2022-06-17 DIAGNOSIS — E7800 Pure hypercholesterolemia, unspecified: Secondary | ICD-10-CM

## 2022-06-17 DIAGNOSIS — Z8616 Personal history of COVID-19: Secondary | ICD-10-CM

## 2022-06-17 DIAGNOSIS — I447 Left bundle-branch block, unspecified: Secondary | ICD-10-CM | POA: Diagnosis not present

## 2022-06-17 DIAGNOSIS — R0609 Other forms of dyspnea: Secondary | ICD-10-CM

## 2022-06-17 DIAGNOSIS — I420 Dilated cardiomyopathy: Secondary | ICD-10-CM | POA: Diagnosis not present

## 2022-06-17 MED ORDER — ENTRESTO 24-26 MG PO TABS: 1.0000 | ORAL_TABLET | Freq: Two times a day (BID) | ORAL | 3 refills | Status: DC

## 2022-06-17 MED ORDER — METOPROLOL SUCCINATE ER 25 MG PO TB24: ORAL_TABLET | ORAL | 3 refills | Status: DC

## 2022-06-17 NOTE — Telephone Encounter (Signed)
     Primary Cardiologist: Buford Dresser, MD  Chart reviewed as part of pre-operative protocol coverage. Given past medical history and time since last visit, based on ACC/AHA guidelines, [place] would be at acceptable risk for the planned procedure without further cardiovascular testing.    I will route this recommendation to the requesting party via Epic fax function and remove from pre-op pool.  Please call with questions.  Jossie Ng. Jeaneen Cala NP-C     06/17/2022, 4:20 PM [facility] [place] [address] Office [phone] Fax [phone]

## 2022-06-17 NOTE — Patient Instructions (Addendum)
Medication Instructions:  Your physician recommends that you continue on your current medications as directed. Please refer to the Current Medication list given to you today.  *If you need a refill on your cardiac medications before your next appointment, please call your pharmacy*  Follow-Up: At [facility], you and your health needs are our priority.  As part of our continuing mission to provide you with exceptional heart care, we have created designated Provider Care Teams.  These Care Teams include your primary Cardiologist (physician) and Advanced Practice Providers (APPs -  Physician Assistants and Nurse Practitioners) who all work together to provide you with the care you need, when you need it.  We recommend signing up for the patient portal called "MyChart".  Sign up information is provided on this After Visit Summary.  MyChart is used to connect with patients for Virtual Visits (Telemedicine).  Patients are able to view lab/test results, encounter notes, upcoming appointments, etc.  Non-urgent messages can be sent to your provider as well.   To learn more about what you can do with MyChart, go to [email or web address].    Your next appointment:   6 month(s)  Provider:   Buford Dresser, MD    Other Instructions Ask if they want you to take your entresto the morning of the procedure (similar to the meds they listed on your sheet.)  Try to take the entresto morning and night time as best you can. Check some blood pressures at home and send me readings via mychart in a few weeks.

## 2022-06-17 NOTE — Progress Notes (Signed)
Cardiology Office Note:    Date:  06/17/2022   ID:  Lauren, Peters 04-26-1955, MRN [id number]  PCP:  Lauren Lass, MD  Cardiologist:  Lauren Dresser, MD  Referring MD: Lauren Lass, MD   CC: Follow-up   History of Present Illness:    Lauren Peters is a 67 y.o. female with a hx of nonischemic cardiomyopathy, acid reflux, nephrolithiasis, and depression, who is seen for follow-up. I initially met her 05/20/2021 as a new consult at the request of Lauren Lass, MD for the evaluation and management of persistent shortness of breath post COVID.  Cardiac history: Initial Covid infection 04/2019, second infection 09/2020, with persistent cough and dyspnea on exertion. Note from Lauren Peters from 03/21/21 noted history of aortic atherosclerosis and recommended consideration of a statin.   Saw Dr. Vaughan Peters once after her Covid hospitalization. CTA 06/28/2019-no pulmonary embolism, peripheral interstitial thickening, mild groundglass in alternate pattern.  Comorbid conditions: Hypertension - 20 years ago she was started on antihypertensives (discontinued 2 months later due to cough and low blood pressures). Since her antihypertensives were discontinued she did not begin any subsequent therapy, and her high blood pressure was thought to be due to white coat syndrome.  Tobacco use history: Former smoker, quit about 40 years ago. Alcohol: Limited to weekends. 3-4 drinks at most. Family history: Her grandparents both had strokes. Her mother had a heart attack about 5 years ago, and is a patient of Dr. Oval Linsey. Her father had Parkinson's.   Today: Pending colonoscopy, reviewed meds today. This is routine colonoscopy, not for symptoms.   Shortness of breath stable, worse with climbing stairs same as prior.  Cough is improving, unclear what has improved.   Denies chest pain, shortness of breath at rest. No PND, orthopnea, LE edema or unexpected weight gain. No syncope or palpitations.   Past Medical  History:  Diagnosis Date   Acid reflux    Anxiety    Depression    History of kidney stones     Past Surgical History:  Procedure Laterality Date   ABDOMINAL HYSTERECTOMY     over 30 years ago   EXTRACORPOREAL SHOCK WAVE LITHOTRIPSY Left 08/23/2018   Procedure: EXTRACORPOREAL SHOCK WAVE LITHOTRIPSY (ESWL);  Surgeon: Lauren Gallo, MD;  Location: [facility];  Service: Urology;  Laterality: Left;   HAND SURGERY Right    x2 on right hand, carpal tunnel, bone removed   LITHOTRIPSY  2007   REDUCTION MAMMAPLASTY     20 years ago   RIGHT/LEFT HEART CATH AND CORONARY ANGIOGRAPHY N/A 07/02/2021   Procedure: RIGHT/LEFT HEART CATH AND CORONARY ANGIOGRAPHY;  Surgeon: Lauren Crome, MD;  Location: [place] [facility];  Service: Cardiovascular;  Laterality: N/A;    Current Medications: Current Outpatient Medications on File Prior to Visit  Medication Sig   buPROPion (WELLBUTRIN) 75 MG tablet Take 75 mg by mouth in the morning.   clobetasol ointment (TEMOVATE) [id number] % Apply 1 application. topically 2 (two) times daily as needed (skin irritation.).   empagliflozin (JARDIANCE) 10 MG TABS tablet Take 1 tablet (10 mg total) by mouth daily before breakfast.   omeprazole (PRILOSEC) 40 MG capsule Take 40 mg by mouth daily.    rosuvastatin (CRESTOR) 10 MG tablet TAKE 1 TABLET BY MOUTH EVERY DAY   No current facility-administered medications on file prior to visit.     Allergies:   Albuterol and Sulfamethoxazole   Social History   Tobacco Use   Smoking status: Never   Smokeless  tobacco: Never  Vaping Use   Vaping Use: Never used  Substance Use Topics   Alcohol use: Yes    Comment: 2x week   Drug use: Never    Family History: family history includes Breast cancer in an other family member.  ROS:   Please see the history of present illness. All other systems are reviewed and negative.    EKGs/Labs/Other Studies Reviewed:    The following studies were reviewed today:  Echo   09/23/2021:  1. Left ventricular ejection fraction, by estimation, is 35 to 40%. The  left ventricle has moderately decreased function. The left ventricle  demonstrates global hypokinesis. There is mild concentric left ventricular  hypertrophy. Indeterminate diastolic  filling due to E-A fusion. The average left ventricular global  longitudinal strain is -10.1 %. The global longitudinal strain is  abnormal.   2. Right ventricular systolic function is normal. The right ventricular  size is normal. There is normal pulmonary artery systolic pressure.   3. The mitral valve is normal in structure. Trivial mitral valve  regurgitation. No evidence of mitral stenosis.   4. The aortic valve is tricuspid. Aortic valve regurgitation is trivial.  No aortic stenosis is present.   5. The inferior vena cava is normal in size with greater than 50%  respiratory variability, suggesting right atrial pressure of 3 mmHg.   Right/Left Heart Cath 07/02/2021: CONCLUSIONS: Normal coronary arteries with right dominant anatomy. LVEDP, 8 mmHg. Mean pulmonary artery pressure 21 mmHg, which based upon updated criteria is mild pulmonary hypertension.  Pulmonary wedge pressure mean 7 mmHg. Fick cardiac output 5 L/min.   RECOMMENDATIONS: Guideline directed therapy for systolic dysfunction as tolerated by blood pressure. QRST is 136 ms.  Resynchronization would be a consideration if medical therapy does not elevate the ejection fraction, however QRS duration is less than the 150 ms threshold that has the best response  Echo 05/28/2021: Sonographer Comments: Suboptimal parasternal window, suboptimal apical  window and patient is morbidly obese. Patient declined Definity use  IMPRESSIONS    1. Diffuse hypokinesis worse in the septum and apex. Left ventricular  ejection fraction, by estimation, is 30 to 35%. The left ventricle has  moderately decreased function. The left ventricle demonstrates global  hypokinesis. The  left ventricular internal  cavity size was moderately dilated. Left ventricular diastolic parameters  are indeterminate.   2. Right ventricular systolic function is normal. The right ventricular  size is normal.   3. The mitral valve is abnormal. Moderate mitral valve regurgitation. No  evidence of mitral stenosis.   4. The aortic valve is tricuspid. There is mild calcification of the  aortic valve. Aortic valve regurgitation is trivial. Aortic valve  sclerosis is present, with no evidence of aortic valve stenosis.   5. The inferior vena cava is normal in size with greater than 50%  respiratory variability, suggesting right atrial pressure of 3 mmHg.   CTA Chest 06/28/2019: FINDINGS: Cardiovascular: No filling defects within the pulmonary arteries arteries to suggest acute pulmonary embolism.   Mediastinum/Nodes: No axillary or supraclavicular adenopathy. No mediastinal hilar adenopathy. No pericardial effusion. Esophagus normal.   Lungs/Pleura: Mild peripheral linear interstitial thickening. Mild peripheral ground-glass opacities in the LEFT upper lobe and LEFT lower lobe. No focal consolidation. No discrete airspace disease. Airways normal. No pulmonary infarction.   Upper Abdomen: Limited view of the liver, kidneys, pancreas are unremarkable. Normal adrenal glands.   Musculoskeletal: No aggressive osseous lesion.   Review of the MIP images confirms the above  findings.   IMPRESSION: 1. Peripheral interstitial thickening and mild ground-glass opacities in lungs are sequelae of resolving COVID infection. No focal consolidation. 2. No evidence acute pulmonary embolism.  RLE Venous Doppler 07/17/2016:  Summary:   - No evidence of deep vein thrombosis involving the visualized    veins of the right lower extremity.  - No evidence of Baker&'s cyst in the popliteal fossa although there    does appear to be a small pocket of fluid near the gastronemius    muscle in the proximal  calf, possible ruptured cyst, 4.4 x 3.9cm?   EKG:  EKG is personally reviewed.   03/18/2022:NSR at 87 bpm, LBBB QRS 138 06/17/2021: NSR at 90 bpm, LBBB QRS 136 ms 05/20/2021: NSR at 87 bpm, IVCB  Recent Labs: 06/18/2021: BUN 14; Creatinine, Ser 0.96; Platelets 277; TSH 1.750 07/02/2021: Hemoglobin 12.9; Potassium 3.9; Sodium 140 08/08/2021: ALT 14   Recent Lipid Panel    Component Value Date/Time   CHOL 152 08/08/2021 1042   TRIG 63 08/08/2021 1042   HDL 64 08/08/2021 1042   CHOLHDL 2.4 08/08/2021 1042   LDLCALC 75 08/08/2021 1042    Physical Exam:    VS:  BP (!) 142/86   Pulse 80   Ht 5' (1.524 m)   Wt 155 lb (70.3 kg)   BMI 30.27 kg/m     Wt Readings from Last 3 Encounters:  06/17/22 155 lb (70.3 kg)  03/18/22 164 lb (74.4 kg)  09/30/21 157 lb 3.2 oz (71.3 kg)    GEN: Well nourished, well developed in no acute distress HEENT: Normal, moist mucous membranes NECK: No JVD CARDIAC: regular rhythm, normal S1 and S2, no rubs or gallops. No murmur. VASCULAR: Radial and DP pulses 2+ bilaterally. No carotid bruits RESPIRATORY:  Clear to auscultation without rales, wheezing or rhonchi  ABDOMEN: Soft, non-tender, non-distended MUSCULOSKELETAL:  Ambulates independently SKIN: Warm and dry, no edema NEUROLOGIC:  Alert and oriented x 3. No focal neuro deficits noted. PSYCHIATRIC:  Normal affect    ASSESSMENT:    1. Dilated cardiomyopathy ([place])   2. LBBB (left bundle branch block)   3. Dyspnea on exertion   4. Personal history of COVID-19   5. Aortic atherosclerosis ([facility])   6. Pure hypercholesterolemia     PLAN:    Dyspnea on exertion History of Covid-19 Dilated cardiomyopathy, nonischemic based on cath LBBB -did not tolerate metoprolol succinate 50 mg dose, is tolerating 12.5 mg daily dose -now taking entresto BID (had been once daily), does struggle with remembering PM dose  -tolerating Jardiance -echo after 3 mos GDMT showed EF 35-40%. Prior was 30-35% -appears  euvolemic, NYHA class 2  Hypertension, white coat -always elevated in the office -discussed home BP monitoring, has checked in the past, not recently. She will check at home and send in numbers via mychart in a few weeks. -declines changes to medications today  Aortic atherosclerosis Hypercholesterolemia -tolerating rosuvastatin 10 mg daily, Started 05/2021 -she is no longer taking baby aspirin per personal choice. No issues with bleeding. -per [facility]  05/02/22: Tchol 126, HDL 58, LDL 51, TG 86 08/08/21: Tchol 152, HDL 64, LDL 75, TG 63 -no CAD on cath  Cardiac risk counseling and prevention recommendations: -recommend heart healthy/Mediterranean diet, with whole grains, fruits, vegetable, fish, lean meats, nuts, and olive oil. Limit salt. -recommend moderate walking, 3-5 times/week for 30-50 minutes each session. Aim for at least 150 minutes.week. Goal should be pace of 3 miles/hours, or walking 1.5 miles in  30 minutes -recommend avoidance of tobacco products. Avoid excess alcohol.   Plan for follow up: 6 months  Lauren Dresser, MD, PhD, [facility]    Medication Adjustments/Labs and Tests Ordered: Current medicines are reviewed at length with the patient today.  Concerns regarding medicines are outlined above.   No orders of the defined types were placed in this encounter.  Meds ordered this encounter  Medications   metoprolol succinate (TOPROL XL) 25 MG 24 hr tablet    Sig: Take 1/2 tablet daily    Dispense:  45 tablet    Refill:  3   sacubitril-valsartan (ENTRESTO) 24-26 MG    Sig: Take 1 tablet by mouth 2 (two) times daily.    Dispense:  180 tablet    Refill:  3   Patient Instructions  Medication Instructions:  Your physician recommends that you continue on your current medications as directed. Please refer to the Current Medication list given to you today.  *If you need a refill on your cardiac medications before your next appointment, please  call your pharmacy*  Follow-Up: At [facility], you and your health needs are our priority.  As part of our continuing mission to provide you with exceptional heart care, we have created designated Provider Care Teams.  These Care Teams include your primary Cardiologist (physician) and Advanced Practice Providers (APPs -  Physician Assistants and Nurse Practitioners) who all work together to provide you with the care you need, when you need it.  We recommend signing up for the patient portal called "MyChart".  Sign up information is provided on this After Visit Summary.  MyChart is used to connect with patients for Virtual Visits (Telemedicine).  Patients are able to view lab/test results, encounter notes, upcoming appointments, etc.  Non-urgent messages can be sent to your provider as well.   To learn more about what you can do with MyChart, go to [email or web address].    Your next appointment:   6 month(s)  Provider:   Buford Dresser, MD    Other Instructions Ask if they want you to take your entresto the morning of the procedure (similar to the meds they listed on your sheet.)  Try to take the entresto morning and night time as best you can. Check some blood pressures at home and send me readings via mychart in a few weeks.  Signed, Lauren Dresser, MD PhD 06/17/2022     [place]

## 2022-06-17 NOTE — Telephone Encounter (Signed)
   Pre-operative Risk Assessment    Patient Name: Lauren Peters  DOB: 26-Jan-1956 MRN: [id number]      Request for Surgical Clearance    Procedure:   Colonoscopy  Date of Surgery:  Clearance 07/09/22                                 Surgeon:  Dr. Juanita Craver Surgeon's Group or Practice Name:  [facility] Phone number:  [phone] Fax number:  [phone]   Type of Clearance Requested:   - Medical    Type of Anesthesia:   Propofol   Additional requests/questions:   None  Barbaraann Faster   06/17/2022, 4:03 PM

## 2022-06-26 DIAGNOSIS — M5451 Vertebrogenic low back pain: Secondary | ICD-10-CM | POA: Diagnosis not present

## 2022-08-26 DIAGNOSIS — L821 Other seborrheic keratosis: Secondary | ICD-10-CM | POA: Diagnosis not present

## 2022-08-26 DIAGNOSIS — L820 Inflamed seborrheic keratosis: Secondary | ICD-10-CM | POA: Diagnosis not present

## 2022-08-26 DIAGNOSIS — L570 Actinic keratosis: Secondary | ICD-10-CM | POA: Diagnosis not present

## 2022-09-17 DIAGNOSIS — Z1211 Encounter for screening for malignant neoplasm of colon: Secondary | ICD-10-CM | POA: Diagnosis not present

## 2022-09-17 DIAGNOSIS — K635 Polyp of colon: Secondary | ICD-10-CM | POA: Diagnosis not present

## 2022-09-17 DIAGNOSIS — D124 Benign neoplasm of descending colon: Secondary | ICD-10-CM | POA: Diagnosis not present

## 2022-09-17 DIAGNOSIS — K5730 Diverticulosis of large intestine without perforation or abscess without bleeding: Secondary | ICD-10-CM | POA: Diagnosis not present

## 2022-09-17 DIAGNOSIS — D125 Benign neoplasm of sigmoid colon: Secondary | ICD-10-CM | POA: Diagnosis not present

## 2022-10-21 DIAGNOSIS — Z124 Encounter for screening for malignant neoplasm of cervix: Secondary | ICD-10-CM | POA: Diagnosis not present

## 2022-10-21 DIAGNOSIS — Z1151 Encounter for screening for human papillomavirus (HPV): Secondary | ICD-10-CM | POA: Diagnosis not present

## 2022-10-21 DIAGNOSIS — Z1272 Encounter for screening for malignant neoplasm of vagina: Secondary | ICD-10-CM | POA: Diagnosis not present

## 2022-10-21 DIAGNOSIS — Z1231 Encounter for screening mammogram for malignant neoplasm of breast: Secondary | ICD-10-CM | POA: Diagnosis not present

## 2022-10-21 DIAGNOSIS — Z6828 Body mass index (BMI) 28.0-28.9, adult: Secondary | ICD-10-CM | POA: Diagnosis not present

## 2022-10-23 DIAGNOSIS — L814 Other melanin hyperpigmentation: Secondary | ICD-10-CM | POA: Diagnosis not present

## 2022-10-23 DIAGNOSIS — L578 Other skin changes due to chronic exposure to nonionizing radiation: Secondary | ICD-10-CM | POA: Diagnosis not present

## 2022-10-23 DIAGNOSIS — L570 Actinic keratosis: Secondary | ICD-10-CM | POA: Diagnosis not present

## 2022-10-23 DIAGNOSIS — W57XXXA Bitten or stung by nonvenomous insect and other nonvenomous arthropods, initial encounter: Secondary | ICD-10-CM | POA: Diagnosis not present

## 2022-10-23 DIAGNOSIS — L820 Inflamed seborrheic keratosis: Secondary | ICD-10-CM | POA: Diagnosis not present

## 2022-10-23 DIAGNOSIS — S00462A Insect bite (nonvenomous) of left ear, initial encounter: Secondary | ICD-10-CM | POA: Diagnosis not present

## 2022-10-23 DIAGNOSIS — L821 Other seborrheic keratosis: Secondary | ICD-10-CM | POA: Diagnosis not present

## 2022-10-23 DIAGNOSIS — D1801 Hemangioma of skin and subcutaneous tissue: Secondary | ICD-10-CM | POA: Diagnosis not present

## 2022-12-24 ENCOUNTER — Ambulatory Visit (HOSPITAL_BASED_OUTPATIENT_CLINIC_OR_DEPARTMENT_OTHER): Payer: Medicare HMO | Admitting: Cardiology

## 2022-12-24 ENCOUNTER — Encounter (HOSPITAL_BASED_OUTPATIENT_CLINIC_OR_DEPARTMENT_OTHER): Payer: Self-pay | Admitting: Cardiology

## 2022-12-24 VITALS — BP 146/83 | HR 88 | Ht 60.0 in | Wt 147.3 lb

## 2022-12-24 DIAGNOSIS — Z79899 Other long term (current) drug therapy: Secondary | ICD-10-CM | POA: Diagnosis not present

## 2022-12-24 DIAGNOSIS — I447 Left bundle-branch block, unspecified: Secondary | ICD-10-CM | POA: Diagnosis not present

## 2022-12-24 DIAGNOSIS — R0609 Other forms of dyspnea: Secondary | ICD-10-CM

## 2022-12-24 DIAGNOSIS — E7800 Pure hypercholesterolemia, unspecified: Secondary | ICD-10-CM | POA: Diagnosis not present

## 2022-12-24 DIAGNOSIS — I700 Atherosclerosis of aorta: Secondary | ICD-10-CM | POA: Diagnosis not present

## 2022-12-24 DIAGNOSIS — I420 Dilated cardiomyopathy: Secondary | ICD-10-CM | POA: Diagnosis not present

## 2022-12-24 MED ORDER — ENTRESTO 49-51 MG PO TABS: 1.0000 | ORAL_TABLET | Freq: Two times a day (BID) | ORAL | 3 refills | Status: DC

## 2022-12-24 NOTE — Patient Instructions (Signed)
Medication Instructions:  INCREASE- Entresto 49-51 mg by mouth twice a day  *If you need a refill on your cardiac medications before your next appointment, please call your pharmacy*   Lab Work: BMP 2-3 weeks  If you have labs (blood work) drawn today and your tests are completely normal, you will receive your results only by: MyChart Message (if you have MyChart) OR A paper copy in the mail If you have any lab test that is abnormal or we need to change your treatment, we will call you to review the results.   Testing/Procedures: None Ordered   Follow-Up: At [facility], you and your health needs are our priority.  As part of our continuing mission to provide you with exceptional heart care, we have created designated Provider Care Teams.  These Care Teams include your primary Cardiologist (physician) and Advanced Practice Providers (APPs -  Physician Assistants and Nurse Practitioners) who all work together to provide you with the care you need, when you need it.  We recommend signing up for the patient portal called "MyChart".  Sign up information is provided on this After Visit Summary.  MyChart is used to connect with patients for Virtual Visits (Telemedicine).  Patients are able to view lab/test results, encounter notes, upcoming appointments, etc.  Non-urgent messages can be sent to your provider as well.   To learn more about what you can do with MyChart, go to [email or web address].    Your next appointment:   6 month(s)  Provider:   Jodelle Red, MD    Other Instructions

## 2022-12-24 NOTE — Progress Notes (Signed)
Cardiology Office Note:  .    Date:  12/24/2022  ID:  AUNESTY WITTEMAN, DOB [birth year], MRN [id number] PCP: Sigmund Hazel, MD  [facility] Providers Cardiologist:  Jodelle Red, MD     History of Present Illness: .    Lauren Peters is a 67 y.o. female with a hx of nonischemic cardiomyopathy, acid reflux, nephrolithiasis, and depression, who is seen for follow-up. I initially met her 05/20/2021 as a new consult at the request of Sigmund Hazel, MD for the evaluation and management of persistent shortness of breath post COVID.   Cardiac history: Initial Covid infection 04/2019, second infection 09/2020, with persistent cough and dyspnea on exertion. Note from Peri Maris from 03/21/21 noted history of aortic atherosclerosis and recommended consideration of a statin.    Saw Dr. Isaiah Serge once after her Covid hospitalization. CTA 06/28/2019-no pulmonary embolism, peripheral interstitial thickening, mild groundglass in alternate pattern.   Comorbid conditions: Hypertension - 20 years ago she was started on antihypertensives (discontinued 2 months later due to cough and low blood pressures). Since her antihypertensives were discontinued she did not begin any subsequent therapy, and her high blood pressure was thought to be due to white coat syndrome.   Tobacco use history: Former smoker, quit about 40 years ago. Alcohol: Limited to weekends. 3-4 drinks at most. Family history: Her grandparents both had strokes. Her mother had a heart attack about 5 years ago, and is a patient of Dr. Duke Salvia. Her father had Parkinson's.    At her visit 06/2022, she was pending routine colonoscopy. Reviewed meds. Her shortness of breath was stable, worse with climbing stairs same as prior. She also noted that her cough was improving. Her BP was 142/86 in the office. Asked to track home blood pressures.   Today, she is not feeling any differently on her current regimen. She still feels short of breath at times,  especially with climbing stairs. However, she does recover quickly. Continues to have a chronic cough, ongoing for 5 years. She remains active and does not feel limited by her symptoms.  She has fallen out of the habit of monitoring her home blood pressures. Confirms that her BP is typically high at clinic visits. In the office her reading is 146/83 today.  Has lost some weight on Jardiance. Her Sherryll Burger has been expensive as she is in the donut hole.  She denies any palpitations, chest pain, peripheral edema, lightheadedness, headaches, syncope, orthopnea, or PND.  ROS:  Please see the history of present illness. ROS otherwise negative except as noted.  (+) Shortness of breath (+) Chronic cough  Studies Reviewed: Marland Kitchen       No new  Physical Exam:    VS:  BP (!) 146/83 (BP Location: Left Arm, Patient Position: Sitting, Cuff Size: Normal)   Pulse 88   Ht 5' (1.524 m)   Wt 147 lb 4.8 oz (66.8 kg)   SpO2 99%   BMI 28.77 kg/m    Wt Readings from Last 3 Encounters:  12/24/22 147 lb 4.8 oz (66.8 kg)  06/17/22 155 lb (70.3 kg)  03/18/22 164 lb (74.4 kg)    GEN: Well nourished, well developed in no acute distress HEENT: Normal, moist mucous membranes NECK: No JVD CARDIAC: regular rhythm, normal S1 and S2, no rubs or gallops. 1/6 systolic murmur. VASCULAR: Radial and DP pulses 2+ bilaterally. No carotid bruits RESPIRATORY:  Clear to auscultation without rales, wheezing or rhonchi  ABDOMEN: Soft, non-tender, non-distended MUSCULOSKELETAL:  Ambulates independently SKIN:  Warm and dry, no edema NEUROLOGIC:  Alert and oriented x 3. No focal neuro deficits noted. PSYCHIATRIC:  Normal affect   ASSESSMENT AND PLAN: .    Dyspnea on exertion History of Covid-19 Dilated cardiomyopathy, nonischemic based on cath LBBB -did not tolerate metoprolol succinate 50 mg dose, is tolerating 12.5 mg daily dose -now taking entresto, does struggle with remembering PM dose. BP has been consistently  elevated. Will increase entresto, check BMET in 2-3 weeks -tolerating Jardiance -echo after 3 mos GDMT showed EF 35-40%. Prior was 30-35%. Recheck for change in symptoms. -appears euvolemic, NYHA class 2   Hypertension, white coat -has been consistently elevated in the office -discussed home BP monitoring, has checked in the past, not recently. We have discussed restarting home checks -increasing entresto as above  Aortic atherosclerosis Hypercholesterolemia -tolerating rosuvastatin 10 mg daily, Started 05/2021 -she is no longer taking baby aspirin per personal choice. No issues with bleeding. -per [facility]  05/02/22: Tchol 126, HDL 58, LDL 51, TG 86 08/08/21: Tchol 152, HDL 64, LDL 75, TG 63 -no CAD on cath   Cardiac risk counseling and prevention recommendations: -recommend heart healthy/Mediterranean diet, with whole grains, fruits, vegetable, fish, lean meats, nuts, and olive oil. Limit salt. -recommend moderate walking, 3-5 times/week for 30-50 minutes each session. Aim for at least 150 minutes.week. Goal should be pace of 3 miles/hours, or walking 1.5 miles in 30 minutes -recommend avoidance of tobacco products. Avoid excess alcohol.   Dispo: Follow-up in 6 months, or sooner as needed.  I,Mathew Stumpf,acting as a Neurosurgeon for [employer], MD.,have documented all relevant documentation on the behalf of Jodelle Red, MD,as directed by  Jodelle Red, MD while in the presence of Jodelle Red, MD.  I, Jodelle Red, MD, have reviewed all documentation for this visit. The documentation on 12/24/22 for the exam, diagnosis, procedures, and orders are all accurate and complete.   Signed, Jodelle Red, MD

## 2023-01-29 DIAGNOSIS — H5213 Myopia, bilateral: Secondary | ICD-10-CM | POA: Diagnosis not present

## 2023-01-29 DIAGNOSIS — H2513 Age-related nuclear cataract, bilateral: Secondary | ICD-10-CM | POA: Diagnosis not present

## 2023-01-29 DIAGNOSIS — H43813 Vitreous degeneration, bilateral: Secondary | ICD-10-CM | POA: Diagnosis not present

## 2023-01-29 DIAGNOSIS — H524 Presbyopia: Secondary | ICD-10-CM | POA: Diagnosis not present

## 2023-01-29 DIAGNOSIS — H52222 Regular astigmatism, left eye: Secondary | ICD-10-CM | POA: Diagnosis not present

## 2023-02-03 DIAGNOSIS — Z79899 Other long term (current) drug therapy: Secondary | ICD-10-CM | POA: Diagnosis not present

## 2023-02-03 LAB — BASIC METABOLIC PANEL
BUN/Creatinine Ratio: 13 (ref 12–28)
BUN: 11 mg/dL (ref 8–27)
CO2: 25 mmol/L (ref 20–29)
Calcium: 9.6 mg/dL (ref 8.7–10.3)
Chloride: 104 mmol/L (ref 96–106)
Creatinine, Ser: 0.85 mg/dL (ref 0.57–1.00)
Glucose: 107 mg/dL — ABNORMAL HIGH (ref 70–99)
Potassium: 4 mmol/L (ref 3.5–5.2)
Sodium: 146 mmol/L — ABNORMAL HIGH (ref 134–144)
eGFR: 75 mL/min/{1.73_m2} (ref >59)

## 2023-02-09 DIAGNOSIS — U071 COVID-19: Secondary | ICD-10-CM | POA: Diagnosis not present

## 2023-03-04 ENCOUNTER — Other Ambulatory Visit (HOSPITAL_BASED_OUTPATIENT_CLINIC_OR_DEPARTMENT_OTHER): Payer: Self-pay | Admitting: Cardiology

## 2023-03-04 DIAGNOSIS — I5042 Chronic combined systolic (congestive) and diastolic (congestive) heart failure: Secondary | ICD-10-CM

## 2023-03-04 DIAGNOSIS — I420 Dilated cardiomyopathy: Secondary | ICD-10-CM

## 2023-03-04 DIAGNOSIS — I700 Atherosclerosis of aorta: Secondary | ICD-10-CM

## 2023-05-06 DIAGNOSIS — E663 Overweight: Secondary | ICD-10-CM | POA: Diagnosis not present

## 2023-05-06 DIAGNOSIS — Z1159 Encounter for screening for other viral diseases: Secondary | ICD-10-CM | POA: Diagnosis not present

## 2023-05-06 DIAGNOSIS — I700 Atherosclerosis of aorta: Secondary | ICD-10-CM | POA: Diagnosis not present

## 2023-05-06 DIAGNOSIS — Z0000 Encounter for general adult medical examination without abnormal findings: Secondary | ICD-10-CM | POA: Diagnosis not present

## 2023-05-06 DIAGNOSIS — F325 Major depressive disorder, single episode, in full remission: Secondary | ICD-10-CM | POA: Diagnosis not present

## 2023-05-06 DIAGNOSIS — Z6828 Body mass index (BMI) 28.0-28.9, adult: Secondary | ICD-10-CM | POA: Diagnosis not present

## 2023-05-06 DIAGNOSIS — R739 Hyperglycemia, unspecified: Secondary | ICD-10-CM | POA: Diagnosis not present

## 2023-05-12 DIAGNOSIS — I700 Atherosclerosis of aorta: Secondary | ICD-10-CM | POA: Diagnosis not present

## 2023-05-12 DIAGNOSIS — Z1159 Encounter for screening for other viral diseases: Secondary | ICD-10-CM | POA: Diagnosis not present

## 2023-05-12 DIAGNOSIS — R739 Hyperglycemia, unspecified: Secondary | ICD-10-CM | POA: Diagnosis not present

## 2023-06-01 ENCOUNTER — Other Ambulatory Visit (HOSPITAL_BASED_OUTPATIENT_CLINIC_OR_DEPARTMENT_OTHER): Payer: Self-pay | Admitting: Cardiology

## 2023-06-01 DIAGNOSIS — I420 Dilated cardiomyopathy: Secondary | ICD-10-CM

## 2023-06-23 DIAGNOSIS — L820 Inflamed seborrheic keratosis: Secondary | ICD-10-CM | POA: Diagnosis not present

## 2023-06-23 DIAGNOSIS — L568 Other specified acute skin changes due to ultraviolet radiation: Secondary | ICD-10-CM | POA: Diagnosis not present

## 2023-07-03 DIAGNOSIS — L249 Irritant contact dermatitis, unspecified cause: Secondary | ICD-10-CM | POA: Diagnosis not present

## 2023-07-03 DIAGNOSIS — L219 Seborrheic dermatitis, unspecified: Secondary | ICD-10-CM | POA: Diagnosis not present

## 2023-08-05 DIAGNOSIS — L814 Other melanin hyperpigmentation: Secondary | ICD-10-CM | POA: Diagnosis not present

## 2023-08-05 DIAGNOSIS — L821 Other seborrheic keratosis: Secondary | ICD-10-CM | POA: Diagnosis not present

## 2023-08-05 DIAGNOSIS — D1801 Hemangioma of skin and subcutaneous tissue: Secondary | ICD-10-CM | POA: Diagnosis not present

## 2023-08-05 DIAGNOSIS — B009 Herpesviral infection, unspecified: Secondary | ICD-10-CM | POA: Diagnosis not present

## 2023-08-05 DIAGNOSIS — L578 Other skin changes due to chronic exposure to nonionizing radiation: Secondary | ICD-10-CM | POA: Diagnosis not present

## 2023-08-05 DIAGNOSIS — D485 Neoplasm of uncertain behavior of skin: Secondary | ICD-10-CM | POA: Diagnosis not present

## 2023-08-05 DIAGNOSIS — L570 Actinic keratosis: Secondary | ICD-10-CM | POA: Diagnosis not present

## 2023-10-22 DIAGNOSIS — Z01419 Encounter for gynecological examination (general) (routine) without abnormal findings: Secondary | ICD-10-CM | POA: Diagnosis not present

## 2023-10-22 DIAGNOSIS — Z6828 Body mass index (BMI) 28.0-28.9, adult: Secondary | ICD-10-CM | POA: Diagnosis not present

## 2023-10-22 DIAGNOSIS — Z1231 Encounter for screening mammogram for malignant neoplasm of breast: Secondary | ICD-10-CM | POA: Diagnosis not present

## 2023-11-09 DIAGNOSIS — M8588 Other specified disorders of bone density and structure, other site: Secondary | ICD-10-CM | POA: Diagnosis not present

## 2023-11-09 DIAGNOSIS — K219 Gastro-esophageal reflux disease without esophagitis: Secondary | ICD-10-CM | POA: Diagnosis not present

## 2023-11-17 ENCOUNTER — Ambulatory Visit
Admission: RE | Admit: 2023-11-17 | Discharge: 2023-11-17 | Disposition: A | Discharge status: Home / Self Care | Source: Ambulatory Visit | Attending: Obstetrics and Gynecology | Admitting: Obstetrics and Gynecology

## 2023-11-17 ENCOUNTER — Other Ambulatory Visit: Payer: Self-pay | Admitting: Obstetrics and Gynecology

## 2023-11-17 DIAGNOSIS — M47814 Spondylosis without myelopathy or radiculopathy, thoracic region: Secondary | ICD-10-CM | POA: Diagnosis not present

## 2023-11-17 DIAGNOSIS — M8588 Other specified disorders of bone density and structure, other site: Secondary | ICD-10-CM

## 2023-11-17 DIAGNOSIS — M47817 Spondylosis without myelopathy or radiculopathy, lumbosacral region: Secondary | ICD-10-CM | POA: Diagnosis not present

## 2023-11-17 DIAGNOSIS — M47812 Spondylosis without myelopathy or radiculopathy, cervical region: Secondary | ICD-10-CM | POA: Diagnosis not present

## 2023-11-29 ENCOUNTER — Other Ambulatory Visit (HOSPITAL_BASED_OUTPATIENT_CLINIC_OR_DEPARTMENT_OTHER): Payer: Self-pay | Admitting: Cardiology

## 2023-11-29 DIAGNOSIS — I420 Dilated cardiomyopathy: Secondary | ICD-10-CM

## 2023-12-01 DIAGNOSIS — M8580 Other specified disorders of bone density and structure, unspecified site: Secondary | ICD-10-CM | POA: Diagnosis not present

## 2023-12-01 DIAGNOSIS — E559 Vitamin D deficiency, unspecified: Secondary | ICD-10-CM | POA: Diagnosis not present

## 2023-12-16 DIAGNOSIS — M545 Low back pain, unspecified: Secondary | ICD-10-CM | POA: Diagnosis not present

## 2023-12-16 DIAGNOSIS — S32010A Wedge compression fracture of first lumbar vertebra, initial encounter for closed fracture: Secondary | ICD-10-CM | POA: Diagnosis not present

## 2023-12-16 DIAGNOSIS — M5136 Other intervertebral disc degeneration, lumbar region with discogenic back pain only: Secondary | ICD-10-CM | POA: Diagnosis not present

## 2024-01-15 ENCOUNTER — Other Ambulatory Visit (HOSPITAL_BASED_OUTPATIENT_CLINIC_OR_DEPARTMENT_OTHER): Payer: Self-pay | Admitting: Cardiology

## 2024-01-15 DIAGNOSIS — I420 Dilated cardiomyopathy: Secondary | ICD-10-CM

## 2024-01-15 DIAGNOSIS — I5042 Chronic combined systolic (congestive) and diastolic (congestive) heart failure: Secondary | ICD-10-CM

## 2024-01-18 ENCOUNTER — Telehealth: Payer: Self-pay | Admitting: Cardiology

## 2024-01-18 DIAGNOSIS — I420 Dilated cardiomyopathy: Secondary | ICD-10-CM

## 2024-01-18 DIAGNOSIS — I5042 Chronic combined systolic (congestive) and diastolic (congestive) heart failure: Secondary | ICD-10-CM

## 2024-01-18 MED ORDER — EMPAGLIFLOZIN 10 MG PO TABS: 10.0000 mg | ORAL_TABLET | Freq: Every day | ORAL | 0 refills | Status: DC

## 2024-01-18 NOTE — Telephone Encounter (Signed)
 RX sent in

## 2024-01-18 NOTE — Telephone Encounter (Signed)
*  STAT* If patient is at the pharmacy, call can be transferred to refill team.   1. Which medications need to be refilled? (please list name of each medication and dose if known)  JARDIANCE  10 MG TABS tablet  2. Which pharmacy/location (including street and city if local pharmacy) is medication to be sent to? [id number] - [place], [place] - [address] AT [address]   3. Do they need a 30 day or 90 day supply?  90 day supply

## 2024-03-01 ENCOUNTER — Other Ambulatory Visit (HOSPITAL_BASED_OUTPATIENT_CLINIC_OR_DEPARTMENT_OTHER): Payer: Self-pay | Admitting: Cardiology

## 2024-03-01 DIAGNOSIS — H5213 Myopia, bilateral: Secondary | ICD-10-CM | POA: Diagnosis not present

## 2024-03-01 DIAGNOSIS — I700 Atherosclerosis of aorta: Secondary | ICD-10-CM

## 2024-03-01 DIAGNOSIS — H2513 Age-related nuclear cataract, bilateral: Secondary | ICD-10-CM | POA: Diagnosis not present

## 2024-03-01 DIAGNOSIS — H524 Presbyopia: Secondary | ICD-10-CM | POA: Diagnosis not present

## 2024-03-01 DIAGNOSIS — H52222 Regular astigmatism, left eye: Secondary | ICD-10-CM | POA: Diagnosis not present

## 2024-03-01 DIAGNOSIS — H43813 Vitreous degeneration, bilateral: Secondary | ICD-10-CM | POA: Diagnosis not present

## 2024-03-04 DIAGNOSIS — E559 Vitamin D deficiency, unspecified: Secondary | ICD-10-CM | POA: Diagnosis not present

## 2024-03-18 ENCOUNTER — Encounter (HOSPITAL_BASED_OUTPATIENT_CLINIC_OR_DEPARTMENT_OTHER): Payer: Self-pay | Admitting: Cardiology

## 2024-03-18 ENCOUNTER — Ambulatory Visit (HOSPITAL_BASED_OUTPATIENT_CLINIC_OR_DEPARTMENT_OTHER): Admitting: Cardiology

## 2024-03-18 VITALS — BP 138/86 | HR 79 | Ht 60.0 in | Wt 148.0 lb

## 2024-03-18 DIAGNOSIS — I428 Other cardiomyopathies: Secondary | ICD-10-CM

## 2024-03-18 DIAGNOSIS — Z79899 Other long term (current) drug therapy: Secondary | ICD-10-CM

## 2024-03-18 DIAGNOSIS — E7800 Pure hypercholesterolemia, unspecified: Secondary | ICD-10-CM

## 2024-03-18 DIAGNOSIS — I700 Atherosclerosis of aorta: Secondary | ICD-10-CM | POA: Diagnosis not present

## 2024-03-18 DIAGNOSIS — I447 Left bundle-branch block, unspecified: Secondary | ICD-10-CM

## 2024-03-18 DIAGNOSIS — I5042 Chronic combined systolic (congestive) and diastolic (congestive) heart failure: Secondary | ICD-10-CM

## 2024-03-18 MED ORDER — SPIRONOLACTONE 25 MG PO TABS: 25.0000 mg | ORAL_TABLET | Freq: Every day | ORAL | 3 refills | Status: AC

## 2024-03-18 NOTE — Progress Notes (Signed)
 Cardiology Office Note:  .    Date:  03/18/2024  ID:  Lauren Peters, DOB [birth year], MRN [id number] PCP: Cleotilde Planas, MD   [facility] Cardiologist:  Shelda Bruckner, MD     History of Present Illness: .    Lauren Peters is a 68 y.o. female with a hx of nonischemic cardiomyopathy, LBBB who is seen for follow-up. I initially met her 05/20/2021 as a new consult at the request of Cleotilde Planas, MD for the evaluation and management of persistent shortness of breath post COVID.   Cardiac history: Initial Covid infection 04/2019, second infection 09/2020, with persistent cough and dyspnea on exertion. Note from Prentice Batch from 03/21/21 noted history of aortic atherosclerosis and recommended consideration of a statin. Most recent echo 09/23/21 with EF 35-40%, global hypokinesis, normal RV, no significant valve disease, normal RAP. This was after 3 mos GDMT. Cath 07/02/21 with normal coronary arteries.   Today: No new concerns. Chronic cough unchanged. No intentional exercise but no limitations. Stays active, hardest activity would be stairs/carrying items from shopping. Coughs with this, which makes her feel short of breath.   ROS:  Denies chest pain, shortness of breath at rest. No PND, orthopnea, LE edema or unexpected weight gain. No syncope or palpitations. ROS otherwise negative except as noted.   Studies Reviewed: [facility]    EKG Interpretation Date/Time:  Friday March 18 2024 08:06:27 EST Ventricular Rate:  79 PR Interval:  154 QRS Duration:  138 QT Interval:  434 QTC Calculation: 497 R Axis:   -46  Text Interpretation: Normal sinus rhythm Left bundle branch block Confirmed by Bruckner Shelda [phone]) on 03/18/2024 8:36:49 AM    Physical Exam:    VS:  BP 138/86 (BP Location: Left Arm, Patient Position: Sitting, Cuff Size: Normal)   Pulse 79   Ht 5' (1.524 m)   Wt 148 lb (67.1 kg)   SpO2 96%   BMI 28.90 kg/m    Wt Readings from Last 3 Encounters:  03/18/24 148  lb (67.1 kg)  12/24/22 147 lb 4.8 oz (66.8 kg)  06/17/22 155 lb (70.3 kg)    GEN: Well nourished, well developed in no acute distress HEENT: Normal, moist mucous membranes NECK: No JVD CARDIAC: regular rhythm, normal S1 and S2, no rubs or gallops. 1/6 systolic murmur. VASCULAR: Radial and DP pulses 2+ bilaterally. No carotid bruits RESPIRATORY:  Clear to auscultation without rales, wheezing or rhonchi  ABDOMEN: Soft, non-tender, non-distended MUSCULOSKELETAL:  Ambulates independently SKIN: Warm and dry, no edema NEUROLOGIC:  Alert and oriented x 3. No focal neuro deficits noted. PSYCHIATRIC:  Normal affect   ASSESSMENT AND PLAN: .    Dyspnea on exertion History of Covid-19, chronic cough Dilated cardiomyopathy, nonischemic based on cath LBBB -did not tolerate metoprolol  succinate 50 mg dose, is tolerating 12.5 mg daily dose -reviewed GDMT at length today. Has been taking entresto  once/day, reviewed goal of twice/day -tolerating Jardiance  -discussed spironolactone , she is concerned about increased urination but is willing to try -echo after 3 mos GDMT showed EF 35-40% in 2023. Prior was 30-35% -appears euvolemic, NYHA class 2 -discussed signs/symptoms to watch for, indication for CRT. We are going to push GDMT as much as we can for the next three months and recheck echo.   Patient instructions: We are going to try to be as aggressive as possible with medication for the next 3 mos. We discussed the 4 pillars today, and we are going to do the following: -Continue metoprolol  succinate  12.5 mg daily -Continue Jardiance  10 mg daily -continue entresto --try very hard to take twice a day, as this is how the medication works best -we are going to start spironolactone  25 mg daily. You may have a little increased urination initially but this should settle out.  It's very important that we recheck kidney function about 2 weeks after starting spironolactone , as this can affect your  potassium. We will give you lab slips for this today.   We will recheck an echocardiogram in 3 mos after being on max meds. If your pump function is worse, we talked about sending you to one of my electrical partners to see if you qualify for the special pacemaker (CRT).  If you have any issues or problems with the medications, let me know. We will bring you back in about 3 mos after the echo to talk about next steps. If you feel dizzy or lightheaded, check your blood pressure and let us  know if your blood pressure is running low (like less than 110 or 100 on the top number).   Hypertension, white coat -has been consistently elevated in the office. Changing meds as above -discussed home BP monitoring, has checked in the past, not recently. We have discussed restarting home checks  Aortic atherosclerosis Hypercholesterolemia -tolerating rosuvastatin  10 mg daily, Started 05/2021 -declines aspirin  (reasonable) -per [facility]  05/12/2023: Tchol 143, HDL 74, LDL 57, TG 53 05/02/22: Tchol 126, HDL 58, LDL 51, TG 86 08/08/21: Tchol 152, HDL 64, LDL 75, TG 63 -no CAD on cath   Cardiac risk counseling and prevention recommendations: -recommend heart healthy/Mediterranean diet, with whole grains, fruits, vegetable, fish, lean meats, nuts, and olive oil. Limit salt. -recommend moderate walking, 3-5 times/week for 30-50 minutes each session. Aim for at least 150 minutes.week. Goal should be pace of 3 miles/hours, or walking 1.5 miles in 30 minutes -recommend avoidance of tobacco products. Avoid excess alcohol.   Dispo: Follow-up in 3 mos after echo  Signed, Shelda Bruckner, MD

## 2024-03-18 NOTE — Patient Instructions (Addendum)
 Medication Instructions:   START taking spironolactone  25 mg by mouth daily    Labwork:  Return for lab work in one week to check a BMET--lab on 3rd floor [address]    Testing/Procedures:  Your physician has requested that you have an echocardiogram. Echocardiography is a painless test that uses sound waves to create images of your heart. It provides your doctor with information about the size and shape of your heart and how well your heart's chambers and valves are working. This procedure takes approximately one hour. There are no restrictions for this procedure.  HAVE ECHO DONE IN 3 MONTHS WITH A FOLLOW-UP APPOINTMENT WITH DR. LONNI SHORTLY THEREAFTER  Please do NOT wear cologne, perfume, aftershave, or lotions (deodorant is allowed). Please arrive 15 minutes prior to your appointment time.  Please note: We ask at that you not bring children with you during ultrasound (echo/ vascular) testing. Due to room size and safety concerns, children are not allowed in the ultrasound rooms during exams. Our front office staff cannot provide observation of children in our lobby area while testing is being conducted. An adult accompanying a patient to their appointment will only be allowed in the ultrasound room at the discretion of the ultrasound technician under special circumstances. We apologize for any inconvenience.    Follow-Up:  AFTER ECHO WITH DR. LONNI IN 3 MONTHS   Special Instructions:     We are going to try to be as aggressive as possible with medication for the next 3 mos. We discussed the 4 pillars today, and we are going to do the following: -Continue metoprolol  succinate 12.5 mg daily -Continue Jardiance  10 mg daily -continue entresto --try very hard to take twice a day, as this is how the medication works best -we are going to start spironolactone  25 mg daily. You may have a little increased urination initially but this should settle out.  It's very important  that we recheck kidney function about 2 weeks after starting spironolactone , as this can affect your potassium. We will give you lab slips for this today.   We will recheck an echocardiogram in 3 mos after being on max meds. If your pump function is worse, we talked about sending you to one of my electrical partners to see if you qualify for the special pacemaker (CRT).  If you have any issues or problems with the medications, let me know. We will bring you back in about 3 mos after the echo to talk about next steps. If you feel dizzy or lightheaded, check your blood pressure and let us  know if your blood pressure is running low (like less than 110 or 100 on the top number).

## 2024-04-01 LAB — BASIC METABOLIC PANEL WITH GFR
BUN/Creatinine Ratio: 11 — ABNORMAL LOW (ref 12–28)
BUN: 10 mg/dL (ref 8–27)
CO2: 24 mmol/L (ref 20–29)
Calcium: 9.5 mg/dL (ref 8.7–10.3)
Chloride: 104 mmol/L (ref 96–106)
Creatinine, Ser: 0.89 mg/dL (ref 0.57–1.00)
Glucose: 140 mg/dL — ABNORMAL HIGH (ref 70–99)
Potassium: 3.9 mmol/L (ref 3.5–5.2)
Sodium: 143 mmol/L (ref 134–144)
eGFR: 71 mL/min/1.73

## 2024-04-12 ENCOUNTER — Other Ambulatory Visit (HOSPITAL_BASED_OUTPATIENT_CLINIC_OR_DEPARTMENT_OTHER): Payer: Self-pay | Admitting: Cardiology

## 2024-04-12 DIAGNOSIS — I420 Dilated cardiomyopathy: Secondary | ICD-10-CM

## 2024-04-14 ENCOUNTER — Other Ambulatory Visit: Payer: Self-pay | Admitting: Cardiology

## 2024-04-14 DIAGNOSIS — I420 Dilated cardiomyopathy: Secondary | ICD-10-CM

## 2024-04-14 DIAGNOSIS — I5042 Chronic combined systolic (congestive) and diastolic (congestive) heart failure: Secondary | ICD-10-CM

## 2024-06-24 ENCOUNTER — Other Ambulatory Visit (HOSPITAL_BASED_OUTPATIENT_CLINIC_OR_DEPARTMENT_OTHER)

## 2024-07-01 ENCOUNTER — Ambulatory Visit (HOSPITAL_BASED_OUTPATIENT_CLINIC_OR_DEPARTMENT_OTHER): Admitting: Cardiology
